# Patient Record
Sex: Female | Born: 1985 | Race: Black or African American | Hispanic: No | Marital: Single | Smoking: Current every day smoker
Health system: Southern US, Community
[De-identification: ages and names within clinical notes are randomized; demographics above are authoritative.]

## PROBLEM LIST (undated history)

## (undated) ENCOUNTER — Emergency Department (HOSPITAL_COMMUNITY): Payer: Medicaid Other

## (undated) DIAGNOSIS — G8929 Other chronic pain: Secondary | ICD-10-CM

## (undated) DIAGNOSIS — K089 Disorder of teeth and supporting structures, unspecified: Secondary | ICD-10-CM

## (undated) HISTORY — PX: EYE SURGERY: SHX253

---
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
## 2006-03-27 ENCOUNTER — Emergency Department (HOSPITAL_COMMUNITY): Admission: EM | Admit: 2006-03-27 | Discharge: 2006-03-27 | Payer: Self-pay | Admitting: *Deleted

## 2006-03-29 ENCOUNTER — Inpatient Hospital Stay (HOSPITAL_COMMUNITY): Admission: AD | Admit: 2006-03-29 | Discharge: 2006-03-29 | Payer: Self-pay | Admitting: Gynecology

## 2006-06-02 ENCOUNTER — Emergency Department (HOSPITAL_COMMUNITY): Admission: EM | Admit: 2006-06-02 | Discharge: 2006-06-03 | Payer: Self-pay | Admitting: Emergency Medicine

## 2006-10-05 ENCOUNTER — Emergency Department (HOSPITAL_COMMUNITY): Admission: EM | Admit: 2006-10-05 | Discharge: 2006-10-05 | Payer: Self-pay | Admitting: Emergency Medicine

## 2006-10-13 ENCOUNTER — Emergency Department (HOSPITAL_COMMUNITY): Admission: EM | Admit: 2006-10-13 | Discharge: 2006-10-13 | Payer: Self-pay | Admitting: Emergency Medicine

## 2007-03-10 ENCOUNTER — Emergency Department (HOSPITAL_COMMUNITY): Admission: EM | Admit: 2007-03-10 | Discharge: 2007-03-10 | Payer: Self-pay | Admitting: Emergency Medicine

## 2007-03-17 ENCOUNTER — Emergency Department (HOSPITAL_COMMUNITY): Admission: EM | Admit: 2007-03-17 | Discharge: 2007-03-17 | Payer: Self-pay | Admitting: Family Medicine

## 2007-03-22 ENCOUNTER — Emergency Department (HOSPITAL_COMMUNITY): Admission: EM | Admit: 2007-03-22 | Discharge: 2007-03-22 | Payer: Self-pay | Admitting: Emergency Medicine

## 2009-02-06 ENCOUNTER — Emergency Department (HOSPITAL_COMMUNITY): Admission: EM | Admit: 2009-02-06 | Discharge: 2009-02-06 | Payer: Self-pay | Admitting: Emergency Medicine

## 2009-08-06 ENCOUNTER — Emergency Department (HOSPITAL_COMMUNITY): Admission: EM | Admit: 2009-08-06 | Discharge: 2009-08-06 | Payer: Self-pay | Admitting: Emergency Medicine

## 2009-12-03 ENCOUNTER — Emergency Department (HOSPITAL_COMMUNITY): Admission: EM | Admit: 2009-12-03 | Discharge: 2009-12-03 | Payer: Self-pay | Admitting: Family Medicine

## 2010-09-29 LAB — WET PREP, GENITAL
Trich, Wet Prep: NONE SEEN
Yeast Wet Prep HPF POC: NONE SEEN

## 2010-09-29 LAB — POCT URINALYSIS DIP (DEVICE)
Nitrite: POSITIVE — AB
Specific Gravity, Urine: 1.025 (ref 1.005–1.030)
Urobilinogen, UA: 1 mg/dL (ref 0.0–1.0)
pH: 5.5 (ref 5.0–8.0)

## 2010-09-29 LAB — GC/CHLAMYDIA PROBE AMP, GENITAL: GC Probe Amp, Genital: NEGATIVE

## 2010-09-30 LAB — POCT URINALYSIS DIP (DEVICE)
Bilirubin Urine: NEGATIVE
Glucose, UA: NEGATIVE mg/dL
Hgb urine dipstick: NEGATIVE
Nitrite: NEGATIVE
Specific Gravity, Urine: >=1.03 (ref 1.005–1.030)
Urobilinogen, UA: 0.2 mg/dL (ref 0.0–1.0)
pH: 6 (ref 5.0–8.0)

## 2011-01-22 ENCOUNTER — Inpatient Hospital Stay (INDEPENDENT_AMBULATORY_CARE_PROVIDER_SITE_OTHER)
Admission: RE | Admit: 2011-01-22 | Discharge: 2011-01-22 | Disposition: A | Discharge status: Home / Self Care | Payer: Self-pay | Source: Ambulatory Visit | Attending: Family Medicine | Admitting: Family Medicine

## 2011-01-22 DIAGNOSIS — L989 Disorder of the skin and subcutaneous tissue, unspecified: Secondary | ICD-10-CM

## 2014-06-09 ENCOUNTER — Emergency Department (INDEPENDENT_AMBULATORY_CARE_PROVIDER_SITE_OTHER)
Admission: EM | Admit: 2014-06-09 | Discharge: 2014-06-09 | Disposition: A | Discharge status: Home / Self Care | Payer: Self-pay | Source: Home / Self Care | Attending: Family Medicine | Admitting: Family Medicine

## 2014-06-09 ENCOUNTER — Encounter (HOSPITAL_COMMUNITY): Payer: Self-pay | Admitting: *Deleted

## 2014-06-09 DIAGNOSIS — S29012A Strain of muscle and tendon of back wall of thorax, initial encounter: Secondary | ICD-10-CM

## 2014-06-09 DIAGNOSIS — L720 Epidermal cyst: Secondary | ICD-10-CM

## 2014-06-09 MED ORDER — CYCLOBENZAPRINE HCL 5 MG PO TABS: 5.0000 mg | ORAL_TABLET | Freq: Three times a day (TID) | ORAL | Status: DC | PRN

## 2014-06-09 NOTE — ED Provider Notes (Signed)
CSN: [id number]     Arrival date & time 06/09/14  1340 History   First MD Initiated Contact with Patient 06/09/14 1438     Chief Complaint  Patient presents with  . Neck Pain   (Consider location/radiation/quality/duration/timing/severity/associated sxs/prior Treatment) Patient is a 28 y.o. female presenting with neck pain. The history is provided by the patient.  Neck Pain Pain location:  Occipital region Quality:  Shooting Pain radiates to:  L scapula and R scapula Pain severity:  Mild Onset quality:  Gradual Duration:  1 week Progression:  Unchanged Chronicity:  New Context: not recent injury   Relieved by:  None tried Worsened by:  Nothing tried Ineffective treatments:  None tried Associated symptoms: no chest pain, no fever, no numbness, no paresis and no tingling     History reviewed. No pertinent past medical history. History reviewed. No pertinent past surgical history. History reviewed. No pertinent family history. History  Substance Use Topics  . Smoking status: Current Every Day Smoker  . Smokeless tobacco: Not on file  . Alcohol Use: Yes   OB History    No data available     Review of Systems  Constitutional: Negative for fever.  Respiratory: Negative.   Cardiovascular: Negative for chest pain.  Musculoskeletal: Positive for back pain and neck pain. Negative for joint swelling.  Skin: Negative.   Neurological: Negative for tingling and numbness.    Allergies  Review of patient's allergies indicates no known allergies.  Home Medications   Prior to Admission medications   Medication Sig Start Date End Date Taking? Authorizing Provider  cyclobenzaprine (FLEXERIL) 5 MG tablet Take 1 tablet (5 mg total) by mouth 3 (three) times daily as needed for muscle spasms. 06/09/14   Linna HoffJames D Raynell Scott, MD   BP 114/70 mmHg  Pulse 83  Temp(Src) 98 F (36.7 C) (Oral)  Resp 16  SpO2 97%  LMP 05/28/2014 Physical Exam  Constitutional: She is oriented to person,  place, and time. She appears well-developed and well-nourished.  Pulmonary/Chest:    Musculoskeletal:       Back:  Neurological: She is alert and oriented to person, place, and time.  Skin: Skin is warm and dry.  Inclusion cyst to right lat breast approx 5mm.  Nursing note and vitals reviewed.   ED Course  Procedures (including critical care time) Labs Review Labs Reviewed - No data to display  Imaging Review No results found.   MDM   1. Strain of mid-back, initial encounter   2. Epidermal inclusion cyst       Linna HoffJames D Jaquay Posthumus, MD 06/09/14 1526

## 2014-06-09 NOTE — ED Notes (Signed)
Pr  Reports   Pain in  Her  Neck   And  l  Shoulder   /  Arm       X  1  Week   denys  A    specefic  Injury     -  Ambulated  To  Room  With    A  Steady  Fluid     Gait                  Pt  Also  Wants  A lump in    Her  r  Breast     Checked  As   Well      She  Reports  She  Had  A  Mammogram  Done    About  6  Months  Ago   Which  She  Reports was  Neg    By  Report      She  States  She  Wants  A  Second  opinoion

## 2014-06-09 NOTE — Discharge Instructions (Signed)
Use heat and medicine and stretch as discussed, see your doctor for breast cyst removal.

## 2014-09-25 ENCOUNTER — Emergency Department (INDEPENDENT_AMBULATORY_CARE_PROVIDER_SITE_OTHER)
Admission: EM | Admit: 2014-09-25 | Discharge: 2014-09-25 | Disposition: A | Discharge status: Home / Self Care | Payer: PRIVATE HEALTH INSURANCE | Source: Home / Self Care | Attending: Family Medicine | Admitting: Family Medicine

## 2014-09-25 ENCOUNTER — Encounter (HOSPITAL_COMMUNITY): Payer: Self-pay | Admitting: Emergency Medicine

## 2014-09-25 DIAGNOSIS — J111 Influenza due to unidentified influenza virus with other respiratory manifestations: Secondary | ICD-10-CM | POA: Diagnosis not present

## 2014-09-25 DIAGNOSIS — R69 Illness, unspecified: Principal | ICD-10-CM

## 2014-09-25 LAB — POCT URINALYSIS DIP (DEVICE)
BILIRUBIN URINE: NEGATIVE
Glucose, UA: NEGATIVE mg/dL
Hgb urine dipstick: NEGATIVE
KETONES UR: NEGATIVE mg/dL
LEUKOCYTES UA: NEGATIVE
Nitrite: NEGATIVE
PH: 6 (ref 5.0–8.0)
PROTEIN: 30 mg/dL — AB
SPECIFIC GRAVITY, URINE: 1.025 (ref 1.005–1.030)
Urobilinogen, UA: 1 mg/dL (ref 0.0–1.0)

## 2014-09-25 LAB — POCT PREGNANCY, URINE: Preg Test, Ur: NEGATIVE

## 2014-09-25 MED ORDER — ONDANSETRON HCL 4 MG/2ML IJ SOLN
INTRAMUSCULAR | Status: AC
  Filled 2014-09-25: qty 2

## 2014-09-25 MED ORDER — SODIUM CHLORIDE 0.9 % IV BOLUS (SEPSIS)
1000.0000 mL | Freq: Once | INTRAVENOUS | Status: AC
  Administered 2014-09-25: 1000 mL via INTRAVENOUS

## 2014-09-25 MED ORDER — ACETAMINOPHEN 325 MG PO TABS
975.0000 mg | ORAL_TABLET | Freq: Once | ORAL | Status: AC
  Administered 2014-09-25: 975 mg via ORAL

## 2014-09-25 MED ORDER — ACETAMINOPHEN 325 MG PO TABS
ORAL_TABLET | ORAL | Status: AC
  Filled 2014-09-25: qty 3

## 2014-09-25 MED ORDER — ONDANSETRON HCL 4 MG/2ML IJ SOLN
4.0000 mg | Freq: Once | INTRAMUSCULAR | Status: AC
  Administered 2014-09-25: 4 mg via INTRAVENOUS

## 2014-09-25 MED ORDER — PROMETHAZINE HCL 25 MG PO TABS
25.0000 mg | ORAL_TABLET | Freq: Four times a day (QID) | ORAL | Status: DC | PRN
Start: 2014-09-25 — End: 2019-06-05

## 2014-09-25 NOTE — ED Provider Notes (Signed)
Martha West is a 29 y.o. female who presents to Urgent Care today for body aches headache and fevers and vomiting. Symptoms started yesterday. Patient developed fever this morning. Patient has tried ibuprofen yesterday. No chest pains or palpitations. Patient did not receive a flu vaccine. She also notes some urinary frequency and urgency over the last several days.    History reviewed. No pertinent past medical history. History reviewed. No pertinent past surgical history. History  Substance Use Topics  . Smoking status: Former Games developermoker  . Smokeless tobacco: Not on file  . Alcohol Use: Yes   ROS as above Medications: No current facility-administered medications for this encounter.   Current Outpatient Prescriptions  Medication Sig Dispense Refill  . promethazine (PHENERGAN) 25 MG tablet Take 1 tablet (25 mg total) by mouth every 6 (six) hours as needed for nausea or vomiting. 30 tablet 0   No Known Allergies   Exam:  BP 135/82 mmHg  Pulse 68  Temp(Src) 102.1 F (38.9 C) (Oral)  Resp 20  SpO2 96%  LMP 09/12/2014 (Exact Date) Gen: Well NAD HEENT: EOMI,  MMM Lungs: Normal work of breathing. CTABL Heart: RRR no MRG Abd: NABS, Soft. Nondistended, Nontender no rebound or guarding Exts: Brisk capillary refill, warm and well perfused.   Patient was given a 1 L normal saline bolus, 4 mg of IV Zofran, and 975 mg of by mouth Tylenol, and felt much better. Her temperature decreased to 102 from 103.58F  Results for orders placed or performed during the hospital encounter of 09/25/14 (from the past 24 hour(s))  POCT urinalysis dip (device)     Status: Abnormal   Collection Time: 09/25/14  1:56 PM  Result Value Ref Range   Glucose, UA NEGATIVE NEGATIVE mg/dL   Bilirubin Urine NEGATIVE NEGATIVE   Ketones, ur NEGATIVE NEGATIVE mg/dL   Specific Gravity, Urine 1.025 1.005 - 1.030   Hgb urine dipstick NEGATIVE NEGATIVE   pH 6.0 5.0 - 8.0   Protein, ur 30 (A) NEGATIVE mg/dL   Urobilinogen, UA 1.0 0.0 - 1.0 mg/dL   Nitrite NEGATIVE NEGATIVE   Leukocytes, UA NEGATIVE NEGATIVE  Pregnancy, urine POC     Status: None   Collection Time: 09/25/14  1:58 PM  Result Value Ref Range   Preg Test, Ur NEGATIVE NEGATIVE   No results found.  Assessment and Plan: 29 y.o. female with influenza. Most likely diagnosis. Currently influenza is prevalent in the community. She does not have any abdominal pain nor abnormalities on pulmonary exam. Treat with NSAIDs and watchful waiting. Patient cannot afford Tamiflu. Treat vomiting with Phenergan.  Discussed warning signs or symptoms. Please see discharge instructions. Patient expresses understanding.     Rodolph BongEvan S Chikita Dogan, MD 09/25/14 [phone]

## 2014-09-25 NOTE — Discharge Instructions (Signed)
Thank you for coming in today. Take up to 2 aleve twice daily as needed for pain and fever.  Use phenergan as needed for vomiting.  Go to the ER if you get worse.  Call or go to the emergency room if you get worse, have trouble breathing, have chest pains, or palpitations.  If your belly pain worsens, or you have high fever, bad vomiting, blood in your stool or black tarry stool go to the Emergency Room.    Influenza Influenza ("the flu") is a viral infection of the respiratory tract. It occurs more often in winter months because people spend more time in close contact with one another. Influenza can make you feel very sick. Influenza easily spreads from person to person (contagious). CAUSES  Influenza is caused by a virus that infects the respiratory tract. You can catch the virus by breathing in droplets from an infected person's cough or sneeze. You can also catch the virus by touching something that was recently contaminated with the virus and then touching your mouth, nose, or eyes. RISKS AND COMPLICATIONS You may be at risk for a more severe case of influenza if you smoke cigarettes, have diabetes, have chronic heart disease (such as heart failure) or lung disease (such as asthma), or if you have a weakened immune system. Elderly people and pregnant women are also at risk for more serious infections. The most common problem of influenza is a lung infection (pneumonia). Sometimes, this problem can require emergency medical care and may be life threatening. SIGNS AND SYMPTOMS  Symptoms typically last 4 to 10 days and may include:  Fever.  Chills.  Headache, body aches, and muscle aches.  Sore throat.  Chest discomfort and cough.  Poor appetite.  Weakness or feeling tired.  Dizziness.  Nausea or vomiting. DIAGNOSIS  Diagnosis of influenza is often made based on your history and a physical exam. A nose or throat swab test can be done to confirm the diagnosis. TREATMENT  In mild  cases, influenza goes away on its own. Treatment is directed at relieving symptoms. For more severe cases, your health care provider may prescribe antiviral medicines to shorten the sickness. Antibiotic medicines are not effective because the infection is caused by a virus, not by bacteria. HOME CARE INSTRUCTIONS  Take medicines only as directed by your health care provider.  Use a cool mist humidifier to make breathing easier.  Get plenty of rest until your temperature returns to normal. This usually takes 3 to 4 days.  Drink enough fluid to keep your urine clear or pale yellow.  Cover yourmouth and nosewhen coughing or sneezing,and wash your handswellto prevent thevirusfrom spreading.  Stay homefromwork orschool untilthe fever is gonefor at least 74full day. PREVENTION  An annual influenza vaccination (flu shot) is the best way to avoid getting influenza. An annual flu shot is now routinely recommended for all adults in the [place] SEEK MEDICAL CARE IF:  You experiencechest pain, yourcough worsens,or you producemore mucus.  Youhave nausea,vomiting, ordiarrhea.  Your fever returns or gets worse. SEEK IMMEDIATE MEDICAL CARE IF:  You havetrouble breathing, you become short of breath,or your skin ornails becomebluish.  You have severe painor stiffnessin the neck.  You develop a sudden headache, or pain in the face or ear.  You have nausea or vomiting that you cannot control. MAKE SURE YOU:   Understand these instructions.  Will watch your condition.  Will get help right away if you are not doing well or get worse. Document  Released: 06/27/2000 Document Revised: 11/14/2013 Document Reviewed: 09/29/2011 [facility] Patient Information 2015 [employer], [place]. This information is not intended to replace advice given to you by your health care provider. Make sure you discuss any questions you have with your health care provider.

## 2014-09-25 NOTE — ED Notes (Signed)
Pt came here today complaining of cold symptoms.  Pt was vomiting when I entered the room and the pt has a fever of 103.5.  Pt states she has generalized body aches.

## 2014-09-27 ENCOUNTER — Telehealth (HOSPITAL_COMMUNITY): Payer: Self-pay | Admitting: *Deleted

## 2014-09-27 NOTE — ED Notes (Signed)
Pt. called on VM on 3/15 for her lab results. I called pt. back. Pt. verified x 2 and given neg. Results. Vassie MoselleYork, Sricharan Lacomb M 09/27/2014

## 2015-01-29 ENCOUNTER — Encounter: Payer: PRIVATE HEALTH INSURANCE | Admitting: Obstetrics & Gynecology

## 2015-02-19 ENCOUNTER — Encounter: Payer: PRIVATE HEALTH INSURANCE | Admitting: Obstetrics & Gynecology

## 2015-06-12 ENCOUNTER — Emergency Department (INDEPENDENT_AMBULATORY_CARE_PROVIDER_SITE_OTHER)
Admission: EM | Admit: 2015-06-12 | Discharge: 2015-06-12 | Disposition: A | Discharge status: Home / Self Care | Payer: Self-pay | Source: Home / Self Care

## 2015-06-12 ENCOUNTER — Emergency Department (INDEPENDENT_AMBULATORY_CARE_PROVIDER_SITE_OTHER): Payer: PRIVATE HEALTH INSURANCE

## 2015-06-12 ENCOUNTER — Encounter (HOSPITAL_COMMUNITY): Payer: Self-pay

## 2015-06-12 DIAGNOSIS — M25512 Pain in left shoulder: Secondary | ICD-10-CM

## 2015-06-12 MED ORDER — HYDROCODONE-ACETAMINOPHEN 5-325 MG PO TABS: 2.0000 | ORAL_TABLET | Freq: Four times a day (QID) | ORAL | Status: DC | PRN

## 2015-06-12 MED ORDER — NAPROXEN 500 MG PO TABS: 500.0000 mg | ORAL_TABLET | Freq: Two times a day (BID) | ORAL | Status: DC

## 2015-06-12 NOTE — Discharge Instructions (Signed)
Heat Therapy °Heat therapy can help ease sore, stiff, injured, and tight muscles and joints. Heat relaxes your muscles, which may help ease your pain. Heat therapy should only be used on old, pre-existing, or long-lasting (chronic) injuries. Do not use heat therapy unless told by your doctor. °HOW TO USE HEAT THERAPY °There are several different kinds of heat therapy, including: °· Moist heat pack. °· Warm water bath. °· Hot water bottle. °· Electric heating pad. °· Heated gel pack. °· Heated wrap. °· Electric heating pad. °GENERAL HEAT THERAPY RECOMMENDATIONS  °· Do not sleep while using heat therapy. Only use heat therapy while you are awake. °· Your skin may turn pink while using heat therapy. Do not use heat therapy if your skin turns red. °· Do not use heat therapy if you have new pain. °· High heat or long exposure to heat can cause burns. Be careful when using heat therapy to avoid burning your skin. °· Do not use heat therapy on areas of your skin that are already irritated, such as with a rash or sunburn. °GET HELP IF:  °· You have blisters, redness, swelling (puffiness), or numbness. °· You have new pain. °· Your pain is worse. °MAKE SURE YOU: °· Understand these instructions. °· Will watch your condition. °· Will get help right away if you are not doing well or get worse. °  °This information is not intended to replace advice given to you by your health care provider. Make sure you discuss any questions you have with your health care provider. °  °Document Released: 09/22/2011 Document Revised: 07/21/2014 Document Reviewed: 08/23/2013 °Elsevier Interactive Patient Education ©2016 Elsevier Inc. ° °

## 2015-06-12 NOTE — ED Provider Notes (Signed)
CSN: [id number]     Arrival date & time 06/12/15  1814 History   None    Chief Complaint  Patient presents with  . Arm Pain  . Headache   (Consider location/radiation/quality/duration/timing/severity/associated sxs/prior Treatment) HPI History obtained from patient:   LOCATION:left shoulder SEVERITY: 4 DURATION: 2 years CONTEXT: Sorting and hanging clothes QUALITY: Ache MODIFYING FACTORS: None ASSOCIATED SYMPTOMS: Neck pain TIMING: Episodic    History reviewed. No pertinent past medical history. History reviewed. No pertinent past surgical history. History reviewed. No pertinent family history. Social History  Substance Use Topics  . Smoking status: Former Games developermoker  . Smokeless tobacco: None  . Alcohol Use: Yes   OB History    No data available     Review of Systems ROS +'ve the shoulder pain  Denies: HEADACHE, NAUSEA, ABDOMINAL PAIN, CHEST PAIN, CONGESTION, DYSURIA, SHORTNESS OF BREATH  Allergies  Review of patient's allergies indicates no known allergies.  Home Medications   Prior to Admission medications   Medication Sig Start Date End Date Taking? Authorizing Provider  promethazine (PHENERGAN) 25 MG tablet Take 1 tablet (25 mg total) by mouth every 6 (six) hours as needed for nausea or vomiting. 09/25/14   Rodolph BongEvan S Corey, MD   Meds Ordered and Administered this Visit  Medications - No data to display  BP 115/76 mmHg  Pulse 90  Temp(Src) 98.1 F (36.7 C) (Oral)  Resp 16  SpO2 100% No data found.   Physical Exam  Constitutional: She appears well-developed and well-nourished.  HENT:  Head: Normocephalic and atraumatic.  Neck: Normal range of motion. Neck supple.  Pulmonary/Chest: Effort normal.  Musculoskeletal:       Left shoulder: She exhibits tenderness and pain. She exhibits normal range of motion, no swelling, no effusion, no crepitus, no spasm and normal strength.       Arms:   ED Course  Procedures (including critical care time)  Labs  Review Labs Reviewed - No data to display  Imaging Review Dg Shoulder Left  06/12/2015  CLINICAL DATA:  Left shoulder pain and superior humerus pain, chronic but more severe today EXAM: LEFT SHOULDER - 2+ VIEW COMPARISON:  None. FINDINGS: Mild inferiorly projecting spur off of the acromion. Mild bony hypertrophy of the greater tuberosity at cuff tendon insertion site. No fracture or dislocation. IMPRESSION: No acute osseous abnormalities. Findings suggest degenerative changes at rotator cuff insertion site on the greater tuberosity. Electronically Signed   By: Esperanza Heiraymond  Rubner M.D.   On: 06/12/2015 20:11     Visual Acuity Review  Right Eye Distance:   Left Eye Distance:   Bilateral Distance:    Right Eye Near:   Left Eye Near:    Bilateral Near:         MDM   1. Shoulder pain, acute, left       No acute abnormality on xray.  Symptomatic treatment If not steadily improving may need to see orthopedic physician.   Rx naprosyn norco Return if new or worsening of symptoms.     Tharon AquasFrank C Lydell Moga, PA 06/13/15 1029

## 2015-06-12 NOTE — ED Notes (Signed)
C/o having problems w left arm for "years" and reports not able to lift w left arm or comb hair

## 2015-08-08 ENCOUNTER — Emergency Department (HOSPITAL_COMMUNITY)
Admission: EM | Admit: 2015-08-08 | Discharge: 2015-08-08 | Disposition: A | Discharge status: Home / Self Care | Payer: Self-pay | Attending: Emergency Medicine | Admitting: Emergency Medicine

## 2015-08-08 ENCOUNTER — Encounter (HOSPITAL_COMMUNITY): Payer: Self-pay | Admitting: Neurology

## 2015-08-08 DIAGNOSIS — F17200 Nicotine dependence, unspecified, uncomplicated: Secondary | ICD-10-CM | POA: Insufficient documentation

## 2015-08-08 DIAGNOSIS — G8929 Other chronic pain: Secondary | ICD-10-CM | POA: Insufficient documentation

## 2015-08-08 DIAGNOSIS — Z791 Long term (current) use of non-steroidal anti-inflammatories (NSAID): Secondary | ICD-10-CM | POA: Insufficient documentation

## 2015-08-08 DIAGNOSIS — K089 Disorder of teeth and supporting structures, unspecified: Secondary | ICD-10-CM

## 2015-08-08 DIAGNOSIS — K0889 Other specified disorders of teeth and supporting structures: Secondary | ICD-10-CM | POA: Insufficient documentation

## 2015-08-08 HISTORY — DX: Other chronic pain: G89.29

## 2015-08-08 HISTORY — DX: Disorder of teeth and supporting structures, unspecified: K08.9

## 2015-08-08 MED ORDER — BUPIVACAINE-EPINEPHRINE (PF) 0.5% -1:200000 IJ SOLN
1.8000 mL | Freq: Once | INTRAMUSCULAR | Status: AC
  Administered 2015-08-08: 1.8 mL

## 2015-08-08 NOTE — Discharge Instructions (Signed)
[employer]  °[employer]  °[facility]  °[address]  °[place], [place] [address]  °Phone [phone]  °[facility] [employer] [facility] in [place], [place], exemplifies the Dental School’s vision to improve the health and quality of life of all [place]ians by creating leaders with a passion to care for the underserved and by leading the nation in community-based, service learning oral health education. °We are committed to offering comprehensive general dental services for adults, children and special needs patients in a safe, caring and professional setting. ° °Appointments: Our clinic is open Monday through Friday 8:00 a.m. until 5:00 p.m. The amount of time scheduled for an appointment depends on the patient’s specific needs. We ask that you keep your appointed time for care or provide 24-hour notice of all appointment changes. Parents or legal guardians must accompany minor children. ° °Payment for Services: Medicaid and other insurance plans are welcome. Payment for services is due when services are rendered and may be made by cash or credit card. If you have dental insurance, we will assist you with your claim submission.  ° °Emergencies: Emergency services will be provided Monday through Friday on a walk-in basis. Please arrive early for emergency services. After hours emergency services will be provided for patients of record as required. ° °Services:  °Comprehensive General Dentistry  °Children’s Dentistry  °Oral Surgery - Extractions  °Root Canals  °Sealants and Tooth Colored Fillings  °Crowns and Bridges  °Dentures and Partial Dentures  °Implant Services  °Periodontal Services and Cleanings  °Cosmetic Tooth Whitening  °Digital Radiography  °3-D/Cone Beam Imaging ° ° °

## 2015-08-08 NOTE — ED Notes (Signed)
Declined W/C at D/C and was escorted to lobby by RN. 

## 2015-08-08 NOTE — ED Provider Notes (Signed)
CSN: [id number]     Arrival date & time 08/08/15  4098 History  By signing my name below, I, Martha West, attest that this documentation has been prepared under the direction and in the presence of [employer], PA-C.  Electronically Signed: Murriel West, ED Scribe. 08/08/2015. 11:40 AM.    Chief Complaint  Patient presents with  . Dental Pain      Patient is a 30 y.o. female presenting with tooth pain. The history is provided by the patient. No language interpreter was used.  Dental Pain  HPI Comments: Martha West is a 30 y.o. female who presents to the Emergency Department complaining of constant, worsening left maxillary dental pain that has been present for 8 years. Pt reports her tooth has fallen out in small pieces for years, and reports her pain has been worse for the last 3 days. Pt denies doing anything to treat her pain prior to coming to ED. Pt states she has never seen a dentist about this, and states she does not have a dentist she regularly sees. Pt denies fever, chills, or any other symptoms.    Past Medical History  Diagnosis Date  . Chronic dental pain    History reviewed. No pertinent past surgical history. No family history on file. Social History  Substance Use Topics  . Smoking status: Current Every Day Smoker  . Smokeless tobacco: None  . Alcohol Use: Yes   OB History    No data available     Review of Systems  A complete 10 system review of systems was obtained and all systems are negative except as noted in the HPI and PMH.    Allergies  Review of patient's allergies indicates no known allergies.  Home Medications   Prior to Admission medications   Medication Sig Start Date End Date Taking? Authorizing Provider  HYDROcodone-acetaminophen (NORCO/VICODIN) 5-325 MG tablet Take 2 tablets by mouth every 6 (six) hours as needed for moderate pain. 06/12/15   Tharon Aquas, PA  naproxen (NAPROSYN) 500 MG tablet Take 1 tablet (500 mg total) by  mouth 2 (two) times daily. 06/12/15   Tharon Aquas, PA  promethazine (PHENERGAN) 25 MG tablet Take 1 tablet (25 mg total) by mouth every 6 (six) hours as needed for nausea or vomiting. 09/25/14   Rodolph Bong, MD   BP 117/74 mmHg  Pulse 88  Temp(Src) 99.1 F (37.3 C)  Resp 16  SpO2 100%  LMP 07/15/2015 Physical Exam  Constitutional: She is oriented to person, place, and time. She appears well-developed and well-nourished. No distress.  HENT:  Head: Normocephalic.  Mouth/Throat: Uvula is midline, oropharynx is clear and moist and mucous membranes are normal. No oropharyngeal exudate, posterior oropharyngeal edema, posterior oropharyngeal erythema or tonsillar abscesses.  External exam shows no asymmetry of the jaw line or face, no signs of obvious swelling, edema, infection. Full active range of motion of the jaw. Neck is supple with full active range of motion, no tenderness to palpation of the soft tissues  Gumline palpated no obvious signs of infection including warmth, redness, abscess, tenderness. Posterior oropharynx clear with no signs of infection, uvula is midline and rises with phonation, tonsils present and normal in size, symmetrical bilateral, tongue is normal soft touch with full active range of motion, floor mouth is soft nontender.   numberous caries throughout Multiple teeth broken at the gumline  Eyes: Conjunctivae are normal. Pupils are equal, round, and reactive to light. Right eye exhibits no  discharge. Left eye exhibits no discharge.  Neck: Normal range of motion. Neck supple. No JVD present. No tracheal deviation present. No thyromegaly present.  Pulmonary/Chest: No stridor.  Lymphadenopathy:    She has no cervical adenopathy.  Neurological: She is alert and oriented to person, place, and time.  Skin: Skin is warm and dry. No rash noted. She is not diaphoretic. No erythema. No pallor.  Psychiatric: She has a normal mood and affect. Her behavior is normal. Judgment  and thought content normal.  Nursing note and vitals reviewed.   ED Course  Procedures (including critical care time)  NERVE BLOCK Performed by: Thermon Leyland Consent: Verbal consent obtained. Required items: required blood products, implants, devices, and special equipment available Time out: Immediately prior to procedure a "time out" was called to verify the correct patient, procedure, equipment, support staff and site/side marked as required.  Indication: Dental pain  Nerve block body site: Right lower jaw   Preparation: Patient was prepped and draped in the usual sterile fashion. Needle gauge: 24 G Location technique: anatomical landmarks  Local anesthetic: Bupivacaine   Anesthetic total: 1.8 ml  Outcome: pain improved Patient tolerance: Patient tolerated the procedure well with no immediate complications.  DIAGNOSTIC STUDIES: Oxygen Saturation is 97% on room air, normal by my interpretation.    COORDINATION OF CARE: 11:05 AM Discussed treatment plan with pt at bedside and pt agreed to plan.      MDM   Final diagnoses:  Pain, dental   Labs:  Imaging:  Consults:  Therapeutics: Dental block  Discharge Meds:   Assessment/Plan: Patient presents with dental pain. She has no signs of infection on exam. Patient has part of her first right lower molar missing, this is likely due to significant dental caries. Patient will be referred to dentist for follow-up evaluation.     I personally performed the services described in this documentation, which was scribed in my presence. The recorded information has been reviewed and is accurate.     Eyvonne Mechanic, PA-C 08/08/15 1435  Benjiman Core, MD 08/09/15 [phone]

## 2015-08-08 NOTE — ED Notes (Signed)
Pt here with left upper dental pain for 8 years. Reports pain has been worse for last 3 days.

## 2015-09-16 ENCOUNTER — Emergency Department (INDEPENDENT_AMBULATORY_CARE_PROVIDER_SITE_OTHER)
Admission: EM | Admit: 2015-09-16 | Discharge: 2015-09-16 | Disposition: A | Discharge status: Home / Self Care | Payer: Self-pay | Source: Home / Self Care | Attending: Family Medicine | Admitting: Family Medicine

## 2015-09-16 ENCOUNTER — Encounter (HOSPITAL_COMMUNITY): Payer: Self-pay

## 2015-09-16 DIAGNOSIS — K219 Gastro-esophageal reflux disease without esophagitis: Secondary | ICD-10-CM

## 2015-09-16 DIAGNOSIS — G43A0 Cyclical vomiting, not intractable: Secondary | ICD-10-CM

## 2015-09-16 DIAGNOSIS — R1115 Cyclical vomiting syndrome unrelated to migraine: Secondary | ICD-10-CM

## 2015-09-16 LAB — POCT URINALYSIS DIP (DEVICE)
Bilirubin Urine: NEGATIVE
Glucose, UA: NEGATIVE mg/dL
KETONES UR: NEGATIVE mg/dL
Leukocytes, UA: NEGATIVE
Nitrite: NEGATIVE
PH: 6 (ref 5.0–8.0)
PROTEIN: NEGATIVE mg/dL
Specific Gravity, Urine: 1.025 (ref 1.005–1.030)
Urobilinogen, UA: 0.2 mg/dL (ref 0.0–1.0)

## 2015-09-16 LAB — POCT PREGNANCY, URINE: PREG TEST UR: NEGATIVE

## 2015-09-16 MED ORDER — OMEPRAZOLE 40 MG PO CPDR: 40.0000 mg | DELAYED_RELEASE_CAPSULE | Freq: Every day | ORAL | Status: DC

## 2015-09-16 NOTE — ED Provider Notes (Signed)
CSN: [id number]     Arrival date & time 09/16/15  1950 History   First MD Initiated Contact with Patient 09/16/15 2128     Chief Complaint  Patient presents with  . Emesis   (Consider location/radiation/quality/duration/timing/severity/associated sxs/prior Treatment) Patient is a 30 y.o. female presenting with vomiting. The history is provided by the patient. No language interpreter was used.  Emesis  Patient presents with complaint of daily vomiting, with heartburn, for the past 1 week.  Reports that she feels well otherwise, does not feel ill.  Is concerned that this may be the presentation of pregnancy.  LMP began Feb 26th and ended March 3rd; has irregular menses.  [facility] this week to see if it would help the heartburn, it did not.    Reports that she has a normal appetite for food; reports that she tends to eat large meals, and upon further questioning reflects that her emesis may be more frequent after she has eaten a full meal.  Last emesis was around 6pm today.  Has occurred about once or twice daily.  Is tolerating other intake without nausea or emesis.   PMHx; no medications.  She has had no abd surgeries.  She is a G2P0 with 2 elective Abs when she was age 30 and [age].   Is not on contraception now, is sexually active.   ROS: denies fevers or chills; no diarrhea, no abdominal pain, no dysuria or polyuria, no vaginal discharge. LMP as above. No chest pain, no palpitations.    Social Hx; is sexually active. Smokes about 1ppd cigarettes, no alcohol, no THC or other drugs.  Past Medical History  Diagnosis Date  . Chronic dental pain    History reviewed. No pertinent past surgical history. No family history on file. Social History  Substance Use Topics  . Smoking status: Current Every Day Smoker  . Smokeless tobacco: None  . Alcohol Use: Yes   OB History    No data available     Review of Systems  Gastrointestinal: Positive for vomiting.   All other systems reviewed and are negative.   Allergies  Review of patient's allergies indicates no known allergies.  Home Medications   Prior to Admission medications   Medication Sig Start Date End Date Taking? Authorizing Provider  HYDROcodone-acetaminophen (NORCO/VICODIN) 5-325 MG tablet Take 2 tablets by mouth every 6 (six) hours as needed for moderate pain. 06/12/15   Tharon AquasFrank C Patrick, PA  naproxen (NAPROSYN) 500 MG tablet Take 1 tablet (500 mg total) by mouth 2 (two) times daily. 06/12/15   Tharon AquasFrank C Patrick, PA  omeprazole (PRILOSEC) 40 MG capsule Take 1 capsule (40 mg total) by mouth daily. 09/16/15   Barbaraann BarthelJames O Celso Granja, MD  promethazine (PHENERGAN) 25 MG tablet Take 1 tablet (25 mg total) by mouth every 6 (six) hours as needed for nausea or vomiting. 09/25/14   Rodolph BongEvan S Corey, MD   Meds Ordered and Administered this Visit  Medications - No data to display  BP 116/87 mmHg  Pulse 95  Temp(Src) 98.1 F (36.7 C) (Oral)  Resp 14  SpO2 100% No data found.   Physical Exam  Constitutional: She appears well-developed and well-nourished. No distress.  HENT:  Head: Normocephalic and atraumatic.  Eyes: Conjunctivae are normal. Pupils are equal, round, and reactive to light.  Neck: Normal range of motion. Neck supple.  Cardiovascular: Normal rate, regular rhythm and normal heart sounds.   No murmur heard. Pulmonary/Chest: Effort  normal and breath sounds normal. No respiratory distress. She has no wheezes. She has no rales. She exhibits no tenderness.  Abdominal: Soft. Bowel sounds are normal. She exhibits no distension and no mass. There is no tenderness. There is no rebound and no guarding.  Lymphadenopathy:    She has no cervical adenopathy.  Neurological: She is alert.  Skin: Skin is warm and dry. She is not diaphoretic.  Psychiatric: She has a normal mood and affect. Her behavior is normal. Judgment and thought content normal.    ED Course  Procedures (including critical care  time)  Labs Review Labs Reviewed  POCT URINALYSIS DIP (DEVICE) - Abnormal; Notable for the following:    Hgb urine dipstick TRACE (*)    All other components within normal limits  POCT PREGNANCY, URINE    Imaging Review No results found.   Visual Acuity Review  Right Eye Distance:   Left Eye Distance:   Bilateral Distance:    Right Eye Near:   Left Eye Near:    Bilateral Near:         MDM   1. Gastroesophageal reflux disease without esophagitis   2. Non-intractable cyclical vomiting with nausea    Patient with intermittent vomiting, which may be related to her recent experience of reflux.  She has a negative pregnancy test in the urgent care today.  She is taking prenatal vitamins, is ambivalent about becoming pregnant (reflects on her age and that she has not had children).  Her exam is essentially unremarkable today for underlying causes. I suspect she may have an element of GERD with possible early hiatal hernia that causes her to vomit after heavy meals.  Encouraged to take PPI daily and reduce the size of her meals, spread her intake out over the day in smaller amounts.  She is encouraged to establish with a primary care doctor for further follow up, or to return to the [facility] if symptoms worsen.   Paula Compton, MD    Barbaraann Barthel, MD 09/16/15 [phone]

## 2015-09-16 NOTE — ED Notes (Signed)
Patient complains of vomiting and heart burn for the past week Last time patient vomited was two hours ago

## 2015-09-16 NOTE — Discharge Instructions (Signed)
It was a pleasure to see you today.  Your pregnancy test in the urgent care center is negative.   I believe your vomiting is related to the heartburn; this may be worsened by eating large meals.  [facility] will also worsen these symptoms, so I recommend stopping it.   I am starting you on an acid blocker, OMEPRAZOLE 40mg  take 1 tablet daily in the morning.    If the vomiting continues, I recommend returning to the urgent care center or establishing with a primary care doctor for follow up.

## 2017-02-05 ENCOUNTER — Ambulatory Visit: Payer: Self-pay | Admitting: Family Medicine

## 2018-01-31 ENCOUNTER — Emergency Department (HOSPITAL_COMMUNITY)
Admission: EM | Admit: 2018-01-31 | Discharge: 2018-01-31 | Disposition: A | Discharge status: Home / Self Care | Payer: Self-pay | Attending: Emergency Medicine | Admitting: Emergency Medicine

## 2018-01-31 ENCOUNTER — Encounter (HOSPITAL_COMMUNITY): Payer: Self-pay

## 2018-01-31 ENCOUNTER — Other Ambulatory Visit: Payer: Self-pay

## 2018-01-31 DIAGNOSIS — R2243 Localized swelling, mass and lump, lower limb, bilateral: Secondary | ICD-10-CM | POA: Insufficient documentation

## 2018-01-31 DIAGNOSIS — R110 Nausea: Secondary | ICD-10-CM | POA: Insufficient documentation

## 2018-01-31 DIAGNOSIS — Z79899 Other long term (current) drug therapy: Secondary | ICD-10-CM | POA: Insufficient documentation

## 2018-01-31 DIAGNOSIS — R42 Dizziness and giddiness: Secondary | ICD-10-CM | POA: Insufficient documentation

## 2018-01-31 DIAGNOSIS — F17200 Nicotine dependence, unspecified, uncomplicated: Secondary | ICD-10-CM | POA: Insufficient documentation

## 2018-01-31 DIAGNOSIS — Z3202 Encounter for pregnancy test, result negative: Secondary | ICD-10-CM

## 2018-01-31 LAB — BASIC METABOLIC PANEL
Anion gap: 8 (ref 5–15)
BUN: 6 mg/dL (ref 6–20)
CHLORIDE: 109 mmol/L (ref 98–111)
CO2: 24 mmol/L (ref 22–32)
CREATININE: 0.78 mg/dL (ref 0.44–1.00)
Calcium: 8.8 mg/dL — ABNORMAL LOW (ref 8.9–10.3)
GFR calc Af Amer: >60 mL/min (ref >60)
GFR calc non Af Amer: >60 mL/min (ref >60)
Glucose, Bld: 153 mg/dL — ABNORMAL HIGH (ref 70–99)
POTASSIUM: 4.5 mmol/L (ref 3.5–5.1)
Sodium: 141 mmol/L (ref 135–145)

## 2018-01-31 LAB — URINALYSIS, ROUTINE W REFLEX MICROSCOPIC
BILIRUBIN URINE: NEGATIVE
Glucose, UA: NEGATIVE mg/dL
HGB URINE DIPSTICK: NEGATIVE
Ketones, ur: 5 mg/dL — AB
Leukocytes, UA: NEGATIVE
NITRITE: NEGATIVE
PH: 5 (ref 5.0–8.0)
Protein, ur: NEGATIVE mg/dL
SPECIFIC GRAVITY, URINE: 1.028 (ref 1.005–1.030)

## 2018-01-31 LAB — CBC
HEMATOCRIT: 46.4 % — AB (ref 36.0–46.0)
HEMOGLOBIN: 15.1 g/dL — AB (ref 12.0–15.0)
MCH: 30.7 pg (ref 26.0–34.0)
MCHC: 32.5 g/dL (ref 30.0–36.0)
MCV: 94.3 fL (ref 78.0–100.0)
Platelets: 283 10*3/uL (ref 150–400)
RBC: 4.92 MIL/uL (ref 3.87–5.11)
RDW: 12.3 % (ref 11.5–15.5)
WBC: 6.2 10*3/uL (ref 4.0–10.5)

## 2018-01-31 LAB — I-STAT BETA HCG BLOOD, ED (MC, WL, AP ONLY): I-stat hCG, quantitative: <5 m[IU]/mL (ref <5)

## 2018-01-31 LAB — CBG MONITORING, ED: GLUCOSE-CAPILLARY: 150 mg/dL — AB (ref 70–99)

## 2018-01-31 NOTE — Discharge Instructions (Signed)
Return to ED for worsening symptoms, severe abdominal pain or chest pain, numbness in your legs, lightheadedness or loss of consciousness.

## 2018-01-31 NOTE — ED Triage Notes (Signed)
Pt states she has been feeling nauseous, dizzy, and has ankle swelling. She is concerned she could be pregnant. LMP 01/27/18

## 2018-01-31 NOTE — ED Provider Notes (Signed)
[facility] EMERGENCY DEPARTMENT Provider Note   CSN: [id number] Arrival date & time: 01/31/18  1449     History   Chief Complaint Chief Complaint  Patient presents with  . Possible Pregnancy    HPI Martha West is a 32 y.o. female who presents to ED for evaluation of nausea, dizziness and bilateral ankle swelling for the past week.  She wears compression stockings that have helped her swelling.  She is concerned that she may be pregnant.  However, she completed her menstrual cycle 4 days ago.  She denies any specific pain.  She states that she will have bilateral ankle swelling when she stands a lot at work.  Denies any chest pain, abdominal pain, abnormal vaginal bleeding, vaginal discharge, urinary symptoms, diarrhea, vomiting, fever.  States that she is here to "get everything checked out."  HPI  Past Medical History:  Diagnosis Date  . Chronic dental pain     Patient Active Problem List   Diagnosis Date Noted  . Chronic dental pain     History reviewed. No pertinent surgical history.   OB History   None      Home Medications    Prior to Admission medications   Medication Sig Start Date End Date Taking? Authorizing Provider  HYDROcodone-acetaminophen (NORCO/VICODIN) 5-325 MG tablet Take 2 tablets by mouth every 6 (six) hours as needed for moderate pain. 06/12/15   Tharon Aquas, PA  naproxen (NAPROSYN) 500 MG tablet Take 1 tablet (500 mg total) by mouth 2 (two) times daily. 06/12/15   Tharon Aquas, PA  omeprazole (PRILOSEC) 40 MG capsule Take 1 capsule (40 mg total) by mouth daily. 09/16/15   Barbaraann Barthel, MD  promethazine (PHENERGAN) 25 MG tablet Take 1 tablet (25 mg total) by mouth every 6 (six) hours as needed for nausea or vomiting. 09/25/14   Rodolph Bong, MD    Family History History reviewed. No pertinent family history.  Social History Social History   Tobacco Use  . Smoking status: Current Every Day Smoker  .  Smokeless tobacco: Never Used  Substance Use Topics  . Alcohol use: Yes  . Drug use: No     Allergies   Patient has no known allergies.   Review of Systems Review of Systems  Constitutional: Negative for appetite change, chills and fever.  HENT: Negative for ear pain, rhinorrhea, sneezing and sore throat.   Eyes: Negative for photophobia and visual disturbance.  Respiratory: Negative for cough, chest tightness, shortness of breath and wheezing.   Cardiovascular: Positive for leg swelling. Negative for chest pain and palpitations.  Gastrointestinal: Positive for nausea. Negative for abdominal pain, blood in stool, constipation, diarrhea and vomiting.  Genitourinary: Negative for dysuria, hematuria and urgency.  Musculoskeletal: Negative for myalgias.  Skin: Negative for rash.  Neurological: Negative for dizziness, weakness and light-headedness.     Physical Exam Updated Vital Signs BP (!) 111/48 (BP Location: Right Arm)   Pulse 83   Temp 98.3 F (36.8 C) (Oral)   Resp 14   Ht 5\' 3"  (1.6 m)   Wt 73.9 kg (163 lb)   LMP 01/27/2018 (Within Days)   SpO2 97%   BMI 28.87 kg/m   Physical Exam  Constitutional: She appears well-developed and well-nourished. No distress.  HENT:  Head: Normocephalic and atraumatic.  Nose: Nose normal.  Eyes: Conjunctivae and EOM are normal. Right eye exhibits no discharge. Left eye exhibits no discharge. No scleral icterus.  Neck: Normal range  of motion. Neck supple.  Cardiovascular: Normal rate, regular rhythm, normal heart sounds and intact distal pulses. Exam reveals no gallop and no friction rub.  No murmur heard. Pulmonary/Chest: Effort normal and breath sounds normal. No respiratory distress.  Abdominal: Soft. Bowel sounds are normal. She exhibits no distension. There is no tenderness. There is no guarding.  Musculoskeletal: Normal range of motion. She exhibits no edema.  No lower extremity edema, erythema or calf tenderness bilaterally.    Neurological: She is alert. She exhibits normal muscle tone. Coordination normal.  Skin: Skin is warm and dry. No rash noted.  Psychiatric: She has a normal mood and affect.  Nursing note and vitals reviewed.    ED Treatments / Results  Labs (all labs ordered are listed, but only abnormal results are displayed) Labs Reviewed  CBC - Abnormal; Notable for the following components:      Result Value   Hemoglobin 15.1 (*)    HCT 46.4 (*)    All other components within normal limits  BASIC METABOLIC PANEL - Abnormal; Notable for the following components:   Glucose, Bld 153 (*)    Calcium 8.8 (*)    All other components within normal limits  URINALYSIS, ROUTINE W REFLEX MICROSCOPIC - Abnormal; Notable for the following components:   APPearance HAZY (*)    Ketones, ur 5 (*)    All other components within normal limits  CBG MONITORING, ED - Abnormal; Notable for the following components:   Glucose-Capillary 150 (*)    All other components within normal limits  I-STAT BETA HCG BLOOD, ED ([facility], [facility], [facility] ONLY)    EKG None  Radiology No results found.  Procedures Procedures (including critical care time)  Medications Ordered in ED Medications - No data to display   Initial Impression / Assessment and Plan / ED Course  I have reviewed the triage vital signs and the nursing notes.  Pertinent labs & imaging results that were available during my care of the patient were reviewed by me and considered in my medical decision making (see chart for details).     32 year old female presents to ED for evaluation of nausea, bilateral ankle swelling and fatigue.  She states that symptoms have been going on for 1 week.  Is concerned that she may be pregnant.  Last menstrual cycle was completed on 7/17.  Denies any chest pain, abdominal pain, injuries or falls, fever, vomiting or diarrhea.  States that the compression stockings help with her ankle swelling.  Swelling is worse when she stands a  lot for work.  On physical exam she is overall well-appearing.  Lungs are clear to auscultation bilaterally.  No lower extremity edema, erythema or calf tenderness noted on my exam.  Vital signs are reassuring.  CBC, BMP, urinalysis, hCG all unremarkable.  She is not pregnant.  5 ketones in urine shows that she may be mildly dehydrated.  She declines IV fluids.  She is comfortable with discharging home with PCP follow-up.  Advised to return to ED for any severe worsening symptoms.  Portions of this note were generated with Scientist, clinical (histocompatibility and immunogenetics)Dragon dictation software. Dictation errors may occur despite best attempts at proofreading.   Final Clinical Impressions(s) / ED Diagnoses   Final diagnoses:  Encounter for pregnancy test with result negative    ED Discharge Orders    None       Dietrich PatesKhatri, Antonisha Waskey, PA-C 01/31/18 1723    Tegeler, Canary Brimhristopher J, MD 02/01/18 [phone]

## 2018-09-13 ENCOUNTER — Emergency Department (HOSPITAL_COMMUNITY): Payer: Self-pay

## 2018-09-13 ENCOUNTER — Other Ambulatory Visit: Payer: Self-pay

## 2018-09-13 ENCOUNTER — Encounter (HOSPITAL_COMMUNITY): Payer: Self-pay | Admitting: Emergency Medicine

## 2018-09-13 ENCOUNTER — Emergency Department (HOSPITAL_COMMUNITY)
Admission: EM | Admit: 2018-09-13 | Discharge: 2018-09-13 | Disposition: A | Discharge status: Home / Self Care | Payer: Self-pay | Attending: Emergency Medicine | Admitting: Emergency Medicine

## 2018-09-13 DIAGNOSIS — J111 Influenza due to unidentified influenza virus with other respiratory manifestations: Secondary | ICD-10-CM | POA: Insufficient documentation

## 2018-09-13 DIAGNOSIS — R69 Illness, unspecified: Secondary | ICD-10-CM

## 2018-09-13 DIAGNOSIS — Z79899 Other long term (current) drug therapy: Secondary | ICD-10-CM | POA: Insufficient documentation

## 2018-09-13 DIAGNOSIS — F17200 Nicotine dependence, unspecified, uncomplicated: Secondary | ICD-10-CM | POA: Insufficient documentation

## 2018-09-13 LAB — CBC WITH DIFFERENTIAL/PLATELET
ABS IMMATURE GRANULOCYTES: 0.02 10*3/uL (ref 0.00–0.07)
Basophils Absolute: 0 10*3/uL (ref 0.0–0.1)
Basophils Relative: 0 %
Eosinophils Absolute: 0 10*3/uL (ref 0.0–0.5)
Eosinophils Relative: 0 %
HCT: 40.8 % (ref 36.0–46.0)
HEMOGLOBIN: 13.3 g/dL (ref 12.0–15.0)
IMMATURE GRANULOCYTES: 0 %
LYMPHS ABS: 1.2 10*3/uL (ref 0.7–4.0)
LYMPHS PCT: 21 %
MCH: 30.9 pg (ref 26.0–34.0)
MCHC: 32.6 g/dL (ref 30.0–36.0)
MCV: 94.7 fL (ref 80.0–100.0)
MONO ABS: 0.7 10*3/uL (ref 0.1–1.0)
MONOS PCT: 12 %
NEUTROS ABS: 4 10*3/uL (ref 1.7–7.7)
Neutrophils Relative %: 67 %
Platelets: 254 10*3/uL (ref 150–400)
RBC: 4.31 MIL/uL (ref 3.87–5.11)
RDW: 14 % (ref 11.5–15.5)
WBC: 6 10*3/uL (ref 4.0–10.5)
nRBC: 0 % (ref 0.0–0.2)

## 2018-09-13 LAB — BASIC METABOLIC PANEL
Anion gap: 7 (ref 5–15)
BUN: 9 mg/dL (ref 6–20)
CHLORIDE: 108 mmol/L (ref 98–111)
CO2: 24 mmol/L (ref 22–32)
Calcium: 9.1 mg/dL (ref 8.9–10.3)
Creatinine, Ser: 0.7 mg/dL (ref 0.44–1.00)
GFR calc Af Amer: >60 mL/min (ref >60)
GFR calc non Af Amer: >60 mL/min (ref >60)
GLUCOSE: 83 mg/dL (ref 70–99)
POTASSIUM: 3.8 mmol/L (ref 3.5–5.1)
Sodium: 139 mmol/L (ref 135–145)

## 2018-09-13 LAB — I-STAT BETA HCG BLOOD, ED (MC, WL, AP ONLY): I-stat hCG, quantitative: <5 m[IU]/mL (ref <5)

## 2018-09-13 MED ORDER — ONDANSETRON 4 MG PO TBDP
4.0000 mg | ORAL_TABLET | Freq: Once | ORAL | Status: AC | PRN
  Administered 2018-09-13: 4 mg via ORAL
  Filled 2018-09-13: qty 1

## 2018-09-13 MED ORDER — SODIUM CHLORIDE 0.9 % IV BOLUS
1000.0000 mL | Freq: Once | INTRAVENOUS | Status: AC
  Administered 2018-09-13: 1000 mL via INTRAVENOUS

## 2018-09-13 MED ORDER — ACETAMINOPHEN 325 MG PO TABS
650.0000 mg | ORAL_TABLET | Freq: Once | ORAL | Status: AC
  Administered 2018-09-13: 650 mg via ORAL
  Filled 2018-09-13: qty 2

## 2018-09-13 NOTE — ED Provider Notes (Signed)
[facility]-EMERGENCY DEPT Provider Note   CSN: [id number] Arrival date & time: 09/13/18  5035    History   Chief Complaint Chief Complaint  Patient presents with  . Cough  . Generalized Body Aches  . Nasal Congestion    HPI Martha West is a 33 y.o. female.     HPI  33 year old female presents with flulike symptoms.  She states that since 2/28 she is been having cough, mild sore throat, body aches.  She had vomiting last night.  She has been feeling weak and dizzy.  She went to work today and they sent her here given concern for the flu.  No diarrhea. No fever.  Past Medical History:  Diagnosis Date  . Chronic dental pain     Patient Active Problem List   Diagnosis Date Noted  . Chronic dental pain     History reviewed. No pertinent surgical history.   OB History   No obstetric history on file.      Home Medications    Prior to Admission medications   Medication Sig Start Date End Date Taking? Authorizing Provider  ibuprofen (ADVIL,MOTRIN) 200 MG tablet Take 400 mg by mouth every 6 (six) hours as needed for headache or moderate pain.   Yes [provider]  Multiple Vitamin (MULTIVITAMIN WITH MINERALS) TABS tablet Take 1 tablet by mouth daily.   Yes [provider]  HYDROcodone-acetaminophen (NORCO/VICODIN) 5-325 MG tablet Take 2 tablets by mouth every 6 (six) hours as needed for moderate pain. Patient not taking: Reported on 09/13/2018 06/12/15   Tharon Aquas, PA  naproxen (NAPROSYN) 500 MG tablet Take 1 tablet (500 mg total) by mouth 2 (two) times daily. Patient not taking: Reported on 09/13/2018 06/12/15   Tharon Aquas, PA  omeprazole (PRILOSEC) 40 MG capsule Take 1 capsule (40 mg total) by mouth daily. Patient not taking: Reported on 09/13/2018 09/16/15   Barbaraann Barthel, MD  promethazine (PHENERGAN) 25 MG tablet Take 1 tablet (25 mg total) by mouth every 6 (six) hours as needed for nausea or vomiting. Patient not  taking: Reported on 09/13/2018 09/25/14   Rodolph Bong, MD    Family History No family history on file.  Social History Social History   Tobacco Use  . Smoking status: Current Every Day Smoker  . Smokeless tobacco: Never Used  Substance Use Topics  . Alcohol use: Yes  . Drug use: No     Allergies   Patient has no known allergies.   Review of Systems Review of Systems  Constitutional: Negative for fever.  HENT: Positive for sore throat.   Respiratory: Positive for cough.   Gastrointestinal: Positive for vomiting.  Musculoskeletal: Positive for myalgias.  Neurological: Positive for light-headedness.  All other systems reviewed and are negative.    Physical Exam Updated Vital Signs BP (!) 156/89 (BP Location: Left Arm)   Pulse 70   Temp 98.6 F (37 C) (Oral)   Resp 18   LMP 08/21/2018   SpO2 97%   Physical Exam Vitals signs and nursing note reviewed.  Constitutional:      General: She is not in acute distress.    Appearance: She is well-developed. She is not ill-appearing or diaphoretic.  HENT:     Head: Normocephalic and atraumatic.     Right Ear: External ear normal.     Left Ear: External ear normal.     Nose: Nose normal.  Eyes:     General:  Right eye: No discharge.        Left eye: No discharge.  Cardiovascular:     Rate and Rhythm: Regular rhythm. Tachycardia present.     Heart sounds: Normal heart sounds.     Comments: HR~100 Pulmonary:     Effort: Pulmonary effort is normal.     Breath sounds: Normal breath sounds. No wheezing, rhonchi or rales.  Abdominal:     General: There is no distension.     Palpations: Abdomen is soft.     Tenderness: There is no abdominal tenderness.  Skin:    General: Skin is warm and dry.  Neurological:     Mental Status: She is alert.  Psychiatric:        Mood and Affect: Mood is not anxious.      ED Treatments / Results  Labs (all labs ordered are listed, but only abnormal results are  displayed) Labs Reviewed  BASIC METABOLIC PANEL  CBC WITH DIFFERENTIAL/PLATELET  I-STAT BETA HCG BLOOD, ED ([facility], [facility], [facility] ONLY)    EKG None  Radiology Dg Chest 2 View  Result Date: 09/13/2018 CLINICAL DATA:  Pt c/o body aches, cough that is non productive and congestion since Friday. EXAM: CHEST - 2 VIEW COMPARISON:  None. FINDINGS: The heart size and mediastinal contours are within normal limits. Both lungs are clear. The visualized skeletal structures are unremarkable. IMPRESSION: No active cardiopulmonary disease. Electronically Signed   By: Norva Pavlov M.D.   On: 09/13/2018 10:15    Procedures Procedures (including critical care time)  Medications Ordered in ED Medications  sodium chloride 0.9 % bolus 1,000 mL (1,000 mLs Intravenous New Bag/Given 09/13/18 0941)  acetaminophen (TYLENOL) tablet 650 mg (650 mg Oral Given 09/13/18 0948)  ondansetron (ZOFRAN-ODT) disintegrating tablet 4 mg (4 mg Oral Given 09/13/18 0929)     Initial Impression / Assessment and Plan / ED Course  I have reviewed the triage vital signs and the nursing notes.  Pertinent labs & imaging results that were available during my care of the patient were reviewed by me and considered in my medical decision making (see chart for details).        Given the patient's vomiting and lightheadedness, labs were obtained.  She is not pregnant and her labs are otherwise benign.  Chest x-ray without pneumonia.  This is most likely a mild flulike illness and we discussed supportive care.  Otherwise she appears stable for discharge home with return precautions.  Final Clinical Impressions(s) / ED Diagnoses   Final diagnoses:  Influenza-like illness    ED Discharge Orders    None       Pricilla Loveless, MD 09/13/18 1025

## 2018-09-13 NOTE — ED Triage Notes (Signed)
Pt c/o body aches, cough that is non productive and congestion since Friday.

## 2018-09-13 NOTE — ED Notes (Signed)
Bed: [facility] Expected date:  Expected time:  Means of arrival:  Comments: 

## 2018-09-13 NOTE — Discharge Instructions (Addendum)
If you develop high fever, vomiting, trouble breathing, coughing blood, or any other new/concerning symptoms then return to the ER for evaluation.

## 2019-05-23 ENCOUNTER — Encounter (HOSPITAL_COMMUNITY): Payer: Self-pay | Admitting: Emergency Medicine

## 2019-05-23 ENCOUNTER — Emergency Department (HOSPITAL_COMMUNITY)
Admission: EM | Admit: 2019-05-23 | Discharge: 2019-05-24 | Disposition: A | Discharge status: Home / Self Care | Payer: Medicaid Other | Attending: Emergency Medicine | Admitting: Emergency Medicine

## 2019-05-23 ENCOUNTER — Other Ambulatory Visit: Payer: Self-pay

## 2019-05-23 DIAGNOSIS — Z5321 Procedure and treatment not carried out due to patient leaving prior to being seen by health care provider: Secondary | ICD-10-CM | POA: Insufficient documentation

## 2019-05-23 DIAGNOSIS — R112 Nausea with vomiting, unspecified: Secondary | ICD-10-CM | POA: Insufficient documentation

## 2019-05-23 LAB — URINALYSIS, ROUTINE W REFLEX MICROSCOPIC
Bilirubin Urine: NEGATIVE
Glucose, UA: NEGATIVE mg/dL
Hgb urine dipstick: NEGATIVE
Ketones, ur: NEGATIVE mg/dL
Nitrite: POSITIVE — AB
Protein, ur: NEGATIVE mg/dL
Specific Gravity, Urine: 1.021 (ref 1.005–1.030)
pH: 6 (ref 5.0–8.0)

## 2019-05-23 LAB — COMPREHENSIVE METABOLIC PANEL
ALT: 15 U/L (ref 0–44)
AST: 20 U/L (ref 15–41)
Albumin: 3.8 g/dL (ref 3.5–5.0)
Alkaline Phosphatase: 62 U/L (ref 38–126)
Anion gap: 7 (ref 5–15)
BUN: 11 mg/dL (ref 6–20)
CO2: 28 mmol/L (ref 22–32)
Calcium: 9.4 mg/dL (ref 8.9–10.3)
Chloride: 107 mmol/L (ref 98–111)
Creatinine, Ser: 0.79 mg/dL (ref 0.44–1.00)
GFR calc Af Amer: >60 mL/min (ref >60)
GFR calc non Af Amer: >60 mL/min (ref >60)
Glucose, Bld: 86 mg/dL (ref 70–99)
Potassium: 4.2 mmol/L (ref 3.5–5.1)
Sodium: 142 mmol/L (ref 135–145)
Total Bilirubin: 0.7 mg/dL (ref 0.3–1.2)
Total Protein: 6.7 g/dL (ref 6.5–8.1)

## 2019-05-23 LAB — CBC
HCT: 45.2 % (ref 36.0–46.0)
Hemoglobin: 14.9 g/dL (ref 12.0–15.0)
MCH: 31.7 pg (ref 26.0–34.0)
MCHC: 33 g/dL (ref 30.0–36.0)
MCV: 96.2 fL (ref 80.0–100.0)
Platelets: 252 10*3/uL (ref 150–400)
RBC: 4.7 MIL/uL (ref 3.87–5.11)
RDW: 12.8 % (ref 11.5–15.5)
WBC: 7.5 10*3/uL (ref 4.0–10.5)
nRBC: 0 % (ref 0.0–0.2)

## 2019-05-23 LAB — I-STAT BETA HCG BLOOD, ED (MC, WL, AP ONLY): I-stat hCG, quantitative: <5 m[IU]/mL (ref <5)

## 2019-05-23 LAB — LIPASE, BLOOD: Lipase: 30 U/L (ref 11–51)

## 2019-05-23 MED ORDER — SODIUM CHLORIDE 0.9% FLUSH: 3.0000 mL | Freq: Once | INTRAVENOUS | Status: DC

## 2019-05-23 NOTE — ED Triage Notes (Signed)
Patient here from home with complaints of nausea, vomiting, headache x1 week. Reports that she needs a note stating that she cannot work because she is afraid of heights.

## 2019-06-05 ENCOUNTER — Encounter (HOSPITAL_COMMUNITY): Payer: Self-pay | Admitting: *Deleted

## 2019-06-05 ENCOUNTER — Other Ambulatory Visit: Payer: Self-pay

## 2019-06-05 ENCOUNTER — Ambulatory Visit (HOSPITAL_COMMUNITY)
Admission: EM | Admit: 2019-06-05 | Discharge: 2019-06-05 | Disposition: A | Discharge status: Home / Self Care | Payer: HRSA Program | Attending: Family Medicine | Admitting: Family Medicine

## 2019-06-05 DIAGNOSIS — Z20828 Contact with and (suspected) exposure to other viral communicable diseases: Secondary | ICD-10-CM | POA: Insufficient documentation

## 2019-06-05 DIAGNOSIS — R05 Cough: Secondary | ICD-10-CM | POA: Diagnosis present

## 2019-06-05 DIAGNOSIS — Z20822 Contact with and (suspected) exposure to covid-19: Secondary | ICD-10-CM

## 2019-06-05 DIAGNOSIS — F17200 Nicotine dependence, unspecified, uncomplicated: Secondary | ICD-10-CM

## 2019-06-05 DIAGNOSIS — R059 Cough, unspecified: Secondary | ICD-10-CM

## 2019-06-05 DIAGNOSIS — L720 Epidermal cyst: Secondary | ICD-10-CM | POA: Insufficient documentation

## 2019-06-05 MED ORDER — BENZONATATE 200 MG PO CAPS: 200.0000 mg | ORAL_CAPSULE | Freq: Two times a day (BID) | ORAL | 0 refills | Status: DC | PRN

## 2019-06-05 NOTE — ED Triage Notes (Signed)
C/O runny nose and productive cough with wheezing x 2 wks without any change.  Denies fevers, chills.  Denies hx asthma.  Also c/o "cyst" to right lateral breast.

## 2019-06-05 NOTE — Discharge Instructions (Signed)
Get plenty of rest Drink more water and fluids Take Tessalon 2 times a day for coughing In order for you to have a referral for your skin cyst to be removed, you must see a primary care doctor.  This is required by [employer].  Your Medicaid card should have your primary care doctor's name on it.

## 2019-06-05 NOTE — ED Provider Notes (Signed)
[facility]    CSN: [id number] Arrival date & time: 06/05/19  1628      History   Chief Complaint Chief Complaint  Patient presents with  . Cough  . Abscess    HPI Martha West is a 33 y.o. female.   HPI  Patient is here for 2 problems.  First she states she is had a cough cold and runny nose for 2 weeks.  The cough is severe.  Is keeping her awake at night.  She coughs so hard that she vomits.  Nonproductive cough.  No shortness of breath.  No history of asthma.  No sweats chills or fever.  No body aches.  She is tired from lack of sleep.  Her chest hurts from the coughing.  She is requesting coronavirus testing. She also complains about a "cyst" on her breast.  Is been present for years.  Sometimes it bothers her.  She states it only hurts when she "messes with it".  Past Medical History:  Diagnosis Date  . Chronic dental pain     Patient Active Problem List   Diagnosis Date Noted  . Chronic dental pain     Past Surgical History:  Procedure Laterality Date  . EYE SURGERY      OB History   No obstetric history on file.      Home Medications    Prior to Admission medications   Medication Sig Start Date End Date Taking? Authorizing Provider  benzonatate (TESSALON) 200 MG capsule Take 1 capsule (200 mg total) by mouth 2 (two) times daily as needed for cough. 06/05/19   Eustace MooreNelson, Tamana Hatfield Sue, MD  omeprazole (PRILOSEC) 40 MG capsule Take 1 capsule (40 mg total) by mouth daily. Patient not taking: Reported on 09/13/2018 09/16/15 06/05/19  Barbaraann BarthelBreen, James O, MD  promethazine (PHENERGAN) 25 MG tablet Take 1 tablet (25 mg total) by mouth every 6 (six) hours as needed for nausea or vomiting. Patient not taking: Reported on 09/13/2018 09/25/14 06/05/19  Rodolph Bongorey, Evan S, MD    Family History Family History  Problem Relation Age of Onset  . Cancer Mother   . HIV Mother   . Healthy Father     Social History Social History   Tobacco Use  . Smoking status:  Current Every Day Smoker    Types: Cigars  . Smokeless tobacco: Never Used  . Tobacco comment: Black & Milds  Substance Use Topics  . Alcohol use: Not Currently  . Drug use: Never     Allergies   Patient has no known allergies.   Review of Systems Review of Systems  Constitutional: Negative for chills and fever.  HENT: Positive for congestion, postnasal drip and rhinorrhea. Negative for ear pain and sore throat.   Eyes: Negative for pain and visual disturbance.  Respiratory: Positive for cough. Negative for shortness of breath.   Cardiovascular: Positive for chest pain. Negative for palpitations.  Gastrointestinal: Negative for abdominal pain and vomiting.  Genitourinary: Negative for dysuria and hematuria.  Musculoskeletal: Negative for arthralgias and back pain.  Skin: Negative for color change and rash.  Neurological: Negative for seizures and syncope.  All other systems reviewed and are negative.    Physical Exam Triage Vital Signs ED Triage Vitals  Enc Vitals Group     BP 06/05/19 1656 118/79     Pulse Rate 06/05/19 1656 88     Resp 06/05/19 1656 16     Temp 06/05/19 1656 98.8 F (37.1 C)  Temp Source 06/05/19 1656 Oral     SpO2 06/05/19 1656 98 %     Weight --      Height --      Head Circumference --      Peak Flow --      Pain Score 06/05/19 1657 10     Pain Loc --      Pain Edu? --      Excl. in [place]? --    No data found.  Updated Vital Signs BP 118/79   Pulse 88   Temp 98.8 F (37.1 C) (Oral)   Resp 16   LMP 06/04/2019 (Exact Date)   SpO2 98%      Physical Exam Constitutional:      General: She is not in acute distress.    Appearance: She is well-developed.  HENT:     Head: Normocephalic and atraumatic.     Right Ear: Tympanic membrane and ear canal normal.     Left Ear: Tympanic membrane and ear canal normal.     Nose: Congestion and rhinorrhea present.     Mouth/Throat:     Mouth: Mucous membranes are moist.     Pharynx: Posterior  oropharyngeal erythema present.  Eyes:     Conjunctiva/sclera: Conjunctivae normal.     Pupils: Pupils are equal, round, and reactive to light.  Neck:     Musculoskeletal: Normal range of motion and neck supple.  Cardiovascular:     Rate and Rhythm: Normal rate and regular rhythm.     Heart sounds: Normal heart sounds.  Pulmonary:     Effort: Pulmonary effort is normal. No respiratory distress.     Breath sounds: Normal breath sounds. No wheezing, rhonchi or rales.  Abdominal:     General: There is no distension.     Palpations: Abdomen is soft.  Musculoskeletal: Normal range of motion.  Lymphadenopathy:     Cervical: No cervical adenopathy.  Skin:    General: Skin is warm and dry.     Comments: Right breast has an epidermal inclusion cyst that measures 2 cm across.  Nontender.  Mobile.  Superficial  Neurological:     Mental Status: She is alert.  Psychiatric:        Mood and Affect: Mood normal.        Behavior: Behavior normal.      UC Treatments / Results  Labs (all labs ordered are listed, but only abnormal results are displayed) Labs Reviewed  SARS CORONAVIRUS 2 (TAT 6-24 HRS)    EKG   Radiology No results found.  Procedures Procedures (including critical care time)  Medications Ordered in UC Medications - No data to display  Initial Impression / Assessment and Plan / UC Course  I have reviewed the triage vital signs and the nursing notes.  Pertinent labs & imaging results that were available during my care of the patient were reviewed by me and considered in my medical decision making (see chart for details).  Clinical Course as of Jun 05 1819  Sun Jun 05, 2019  1715 SARS CORONAVIRUS 2 (TAT 6-24 HRS) Nasopharyngeal Nasopharyngeal Swab [YN]    Clinical Course User Index [YN] Raylene Everts, MD     Final Clinical Impressions(s) / UC Diagnoses   Final diagnoses:  Cough  Suspected COVID-19 virus infection  Epidermal inclusion cyst      Discharge Instructions     Get plenty of rest Drink more water and fluids Take Tessalon 2 times a day for  coughing In order for you to have a referral for your skin cyst to be removed, you must see a primary care doctor.  This is required by [employer].  Your Medicaid card should have your primary care doctor's name on it.   ED Prescriptions    Medication Sig Dispense Auth. Provider   benzonatate (TESSALON) 200 MG capsule Take 1 capsule (200 mg total) by mouth 2 (two) times daily as needed for cough. 20 capsule Eustace Moore, MD     PDMP not reviewed this encounter.   Eustace Moore, MD 06/05/19 [phone]

## 2019-06-06 LAB — NOVEL CORONAVIRUS, NAA (HOSP ORDER, SEND-OUT TO REF LAB; TAT 18-24 HRS): SARS-CoV-2, NAA: NOT DETECTED

## 2019-07-19 ENCOUNTER — Encounter (HOSPITAL_COMMUNITY): Payer: Self-pay

## 2019-07-19 ENCOUNTER — Other Ambulatory Visit: Payer: Self-pay

## 2019-07-19 ENCOUNTER — Ambulatory Visit (HOSPITAL_COMMUNITY)
Admission: EM | Admit: 2019-07-19 | Discharge: 2019-07-19 | Disposition: A | Discharge status: Home / Self Care | Payer: Self-pay | Attending: Family Medicine | Admitting: Family Medicine

## 2019-07-19 DIAGNOSIS — M542 Cervicalgia: Secondary | ICD-10-CM

## 2019-07-19 DIAGNOSIS — M62830 Muscle spasm of back: Secondary | ICD-10-CM

## 2019-07-19 DIAGNOSIS — G44209 Tension-type headache, unspecified, not intractable: Secondary | ICD-10-CM

## 2019-07-19 MED ORDER — TIZANIDINE HCL 4 MG PO TABS: 4.0000 mg | ORAL_TABLET | Freq: Four times a day (QID) | ORAL | 0 refills | Status: AC | PRN

## 2019-07-19 MED ORDER — IBUPROFEN 800 MG PO TABS: 800.0000 mg | ORAL_TABLET | Freq: Three times a day (TID) | ORAL | 0 refills | Status: AC
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
## 2019-07-19 NOTE — Discharge Instructions (Signed)
Rest.  Activity as tolerated.  Avoid bending, lifting, or movements that cause pain Use ice or heat to painful neck and back muscles Take ibuprofen 3 times a day with food Take tizanidine as needed as a muscle relaxer.  This is useful at bedtime Call or return if unable to return to work by Friday

## 2020-01-07 IMAGING — CR DG CHEST 2V
2 series · 2 of 2 positions shown · non-contrast
Comparison: None.

CLINICAL DATA: Pt c/o body aches, cough that is non productive and
congestion since [REDACTED].

EXAM:
CHEST - 2 VIEW

[w chest pa]
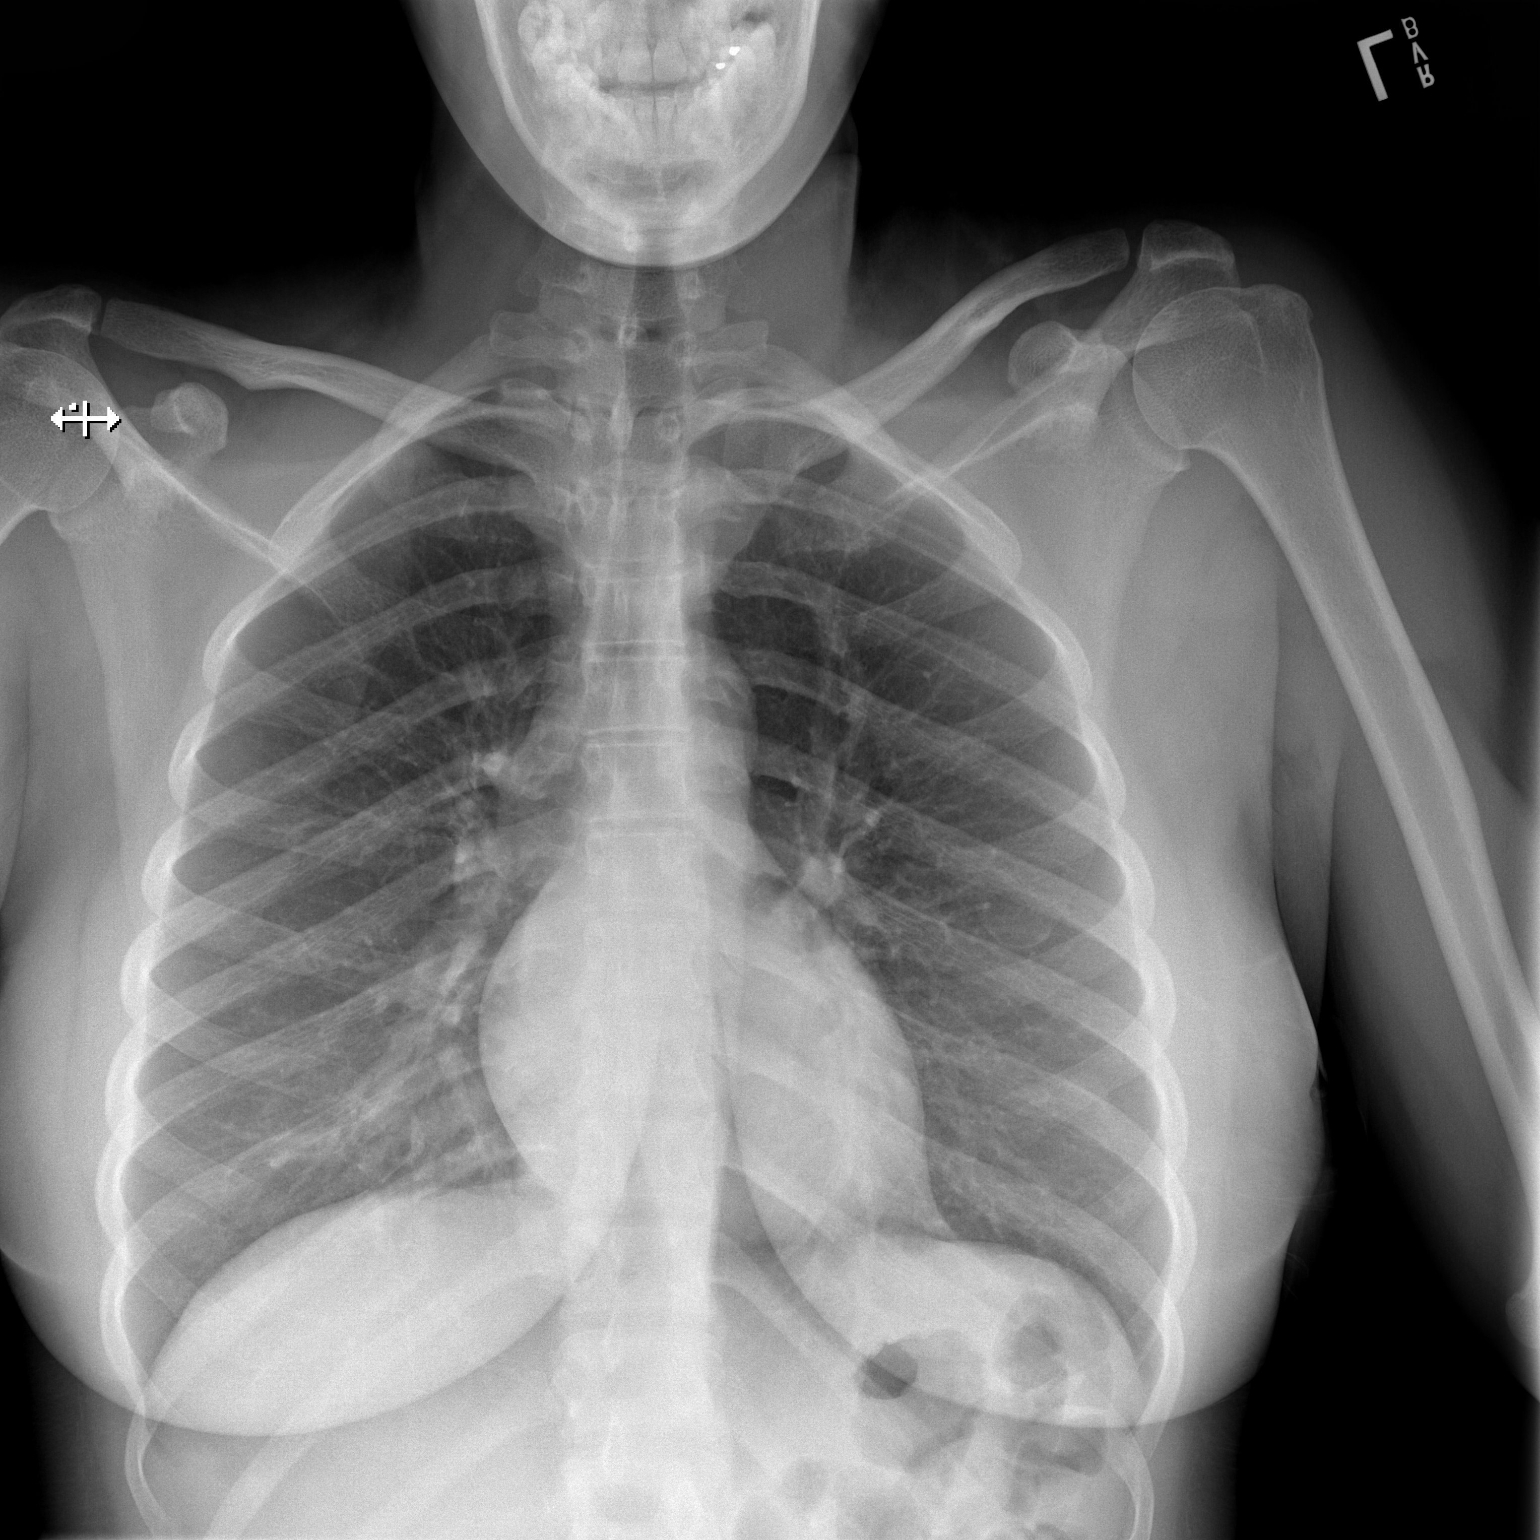

[w chest lat]
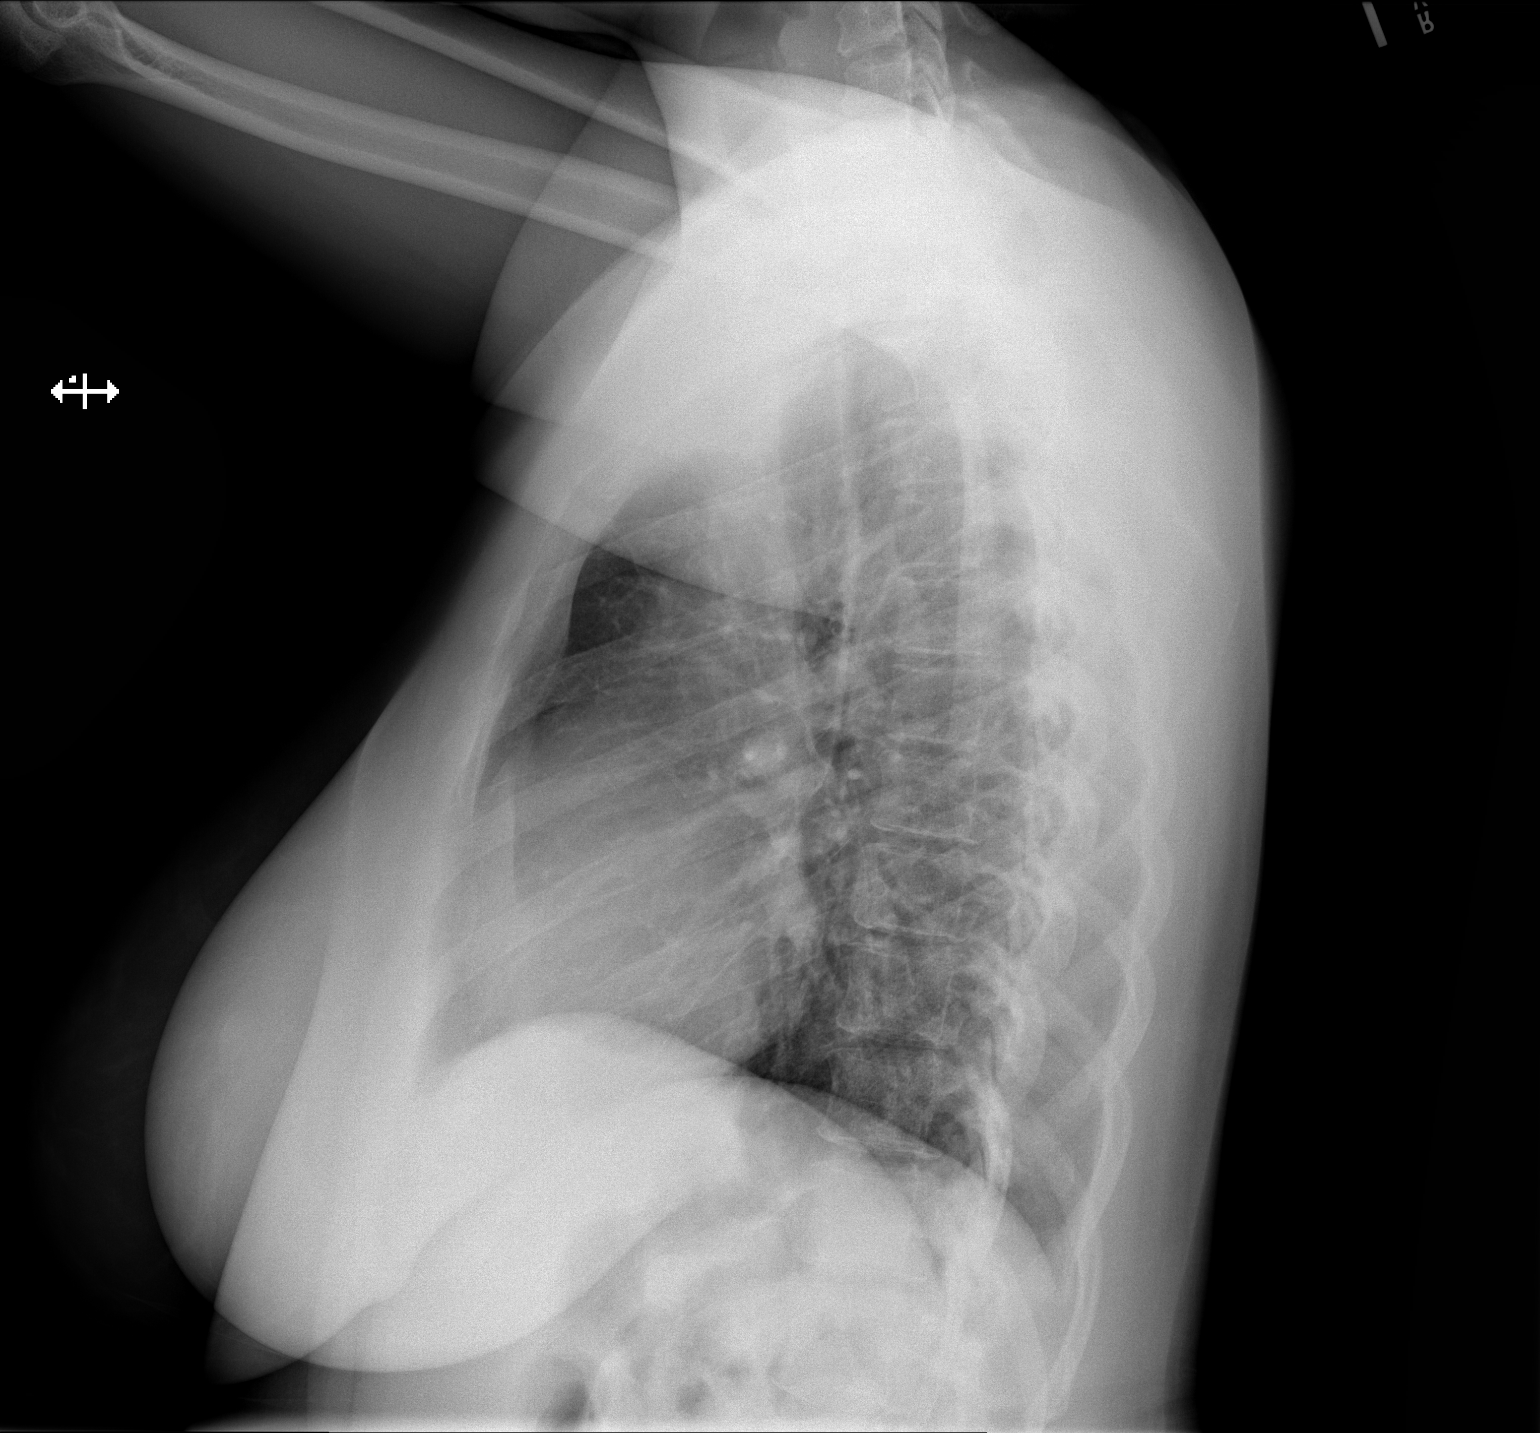

[2 of 2 positions shown; findings below may reference images not displayed]

FINDINGS: The heart size and mediastinal contours are within normal limits.
Both lungs are clear. The visualized skeletal structures are
unremarkable.
IMPRESSION: No active cardiopulmonary disease.

## 2020-04-12 ENCOUNTER — Ambulatory Visit (HOSPITAL_COMMUNITY)
Admission: EM | Admit: 2020-04-12 | Discharge: 2020-04-12 | Disposition: A | Discharge status: Home / Self Care | Payer: No Payment, Other | Attending: Psychiatry | Admitting: Psychiatry

## 2020-04-12 ENCOUNTER — Other Ambulatory Visit: Payer: Self-pay

## 2020-04-12 DIAGNOSIS — R45851 Suicidal ideations: Secondary | ICD-10-CM | POA: Diagnosis not present

## 2020-04-12 DIAGNOSIS — Z915 Personal history of self-harm: Secondary | ICD-10-CM | POA: Insufficient documentation

## 2020-04-12 DIAGNOSIS — F331 Major depressive disorder, recurrent, moderate: Secondary | ICD-10-CM | POA: Insufficient documentation

## 2020-04-12 DIAGNOSIS — F1010 Alcohol abuse, uncomplicated: Secondary | ICD-10-CM | POA: Insufficient documentation

## 2020-04-12 DIAGNOSIS — F332 Major depressive disorder, recurrent severe without psychotic features: Secondary | ICD-10-CM | POA: Insufficient documentation
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
## 2020-04-12 NOTE — BH Assessment (Signed)
Comprehensive Clinical Assessment (CCA) Note  04/12/2020 Martha West [id number]   Patient states that she has been depressed lately and having vague suicidal plans with no intent or plan.  Patient states that she has been drinking daily to self-medicate her emotions.  Patient states that she has been stressed out financially which is exacerbating her depression.  Patient states that she has been on medications for depression prescribed by her PCP, but states that she has not been on medication in awhile and states that she feels like she could benefit from being on medications.  Patient denies current SI, but states that she has been experiencing passive suicidal thoughts, but has no plan or intent and states that she has never attempted suicide in the past.  Patient denies HI/Psychosis.  Patient states that she has not been sleeping or eating well recently.  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  Patient states that she is single, lives alone and has no children.  Patient is employed by [employer].  Patient presents as alert and oriented.  Her mood is depressed and her affect flat.  Her judgment, insight and impulse control are mildly impaired.  Her thoughts are organized and her memory intact.  She does not appear to be responding to any internal stimuli.    Visit Diagnosis:      ICD-10-CM   1. Moderate episode of recurrent major depressive disorder (HCC)  F33.1       CCA Screening, Triage and Referral (STR)  Patient Reported Information How did you hear about [place]? Self  Referral name: No data recorded Referral phone number: No data recorded  Whom do you see for routine medical problems? No data recorded Practice/Facility Name: No data recorded Practice/Facility Phone Number: No data recorded Name of Contact: No data recorded Contact Number: No data recorded Contact Fax Number: No data  recorded Prescriber Name: No data recorded Prescriber Address (if known): No data recorded  What Is the Reason for Your Visit/Call Today? Patient states that she has been depressed with passive suicidal thoughts.  States that she has been self medicating her depression for the past year with alcohol  How Long Has This Been Causing You Problems? > than 6 months  What Do You Feel Would Help You the Most Today? Medication   Have You Recently Been in Any Inpatient Treatment (Hospital/Detox/Crisis Center/28-Day Program)? No  Name/Location of Program/Hospital:No data recorded How Long Were You There? No data recorded When Were You Discharged? No data recorded  Have You Ever Received Services From [facility] Before? No  Who Do You See at [facility]? No data recorded  Have You Recently Had Any Thoughts About Hurting Yourself? Yes (did not have a plan or any intent)  Are You Planning to Commit Suicide/Harm Yourself At This time? No   Have you Recently Had Thoughts About Hurting Someone Karolee Ohs? No  Explanation: No data recorded  Have You Used Any Alcohol or Drugs in the Past 24 Hours? Yes  How Long Ago Did You Use Drugs or Alcohol? No data recorded What Did You Use and How Much? patient states that she has been drinking 3-4 mixed drinks for the past year   Do You Currently Have a Therapist/Psychiatrist? No  Name of Therapist/Psychiatrist: No data recorded  Have You Been Recently Discharged From Any Office Practice or Programs? No  Explanation of Discharge From Practice/Program: No data recorded  CCA Screening Triage Referral Assessment Type of Contact: Face-to-Face  Is this Initial or Reassessment? No data recorded Date Telepsych consult ordered in [facility]:  No data recorded Time Telepsych consult ordered in [facility]:  No data recorded  Patient Reported Information Reviewed? Yes  Patient Left Without Being Seen? No data recorded Reason for Not Completing Assessment: No data  recorded  Collateral Involvement: No collateral information available   Does Patient Have a Court Appointed Legal Guardian? No data recorded Name and Contact of Legal Guardian: No data recorded If Minor and Not Living with Parent(s), Who has Custody? No data recorded Is CPS involved or ever been involved? Never  Is APS involved or ever been involved? Never   Patient Determined To Be At Risk for Harm To Self or Others Based on Review of Patient Reported Information or Presenting Complaint? No (patient states that she came to get help for herself prior to causing any self-harm to herself.)  Method: No data recorded Availability of Means: No data recorded Intent: No data recorded Notification Required: No data recorded Additional Information for Danger to Others Potential: No data recorded Additional Comments for Danger to Others Potential: No data recorded Are There Guns or Other Weapons in Your Home? No data recorded Types of Guns/Weapons: No data recorded Are These Weapons Safely Secured?                            No data recorded Who Could Verify You Are Able To Have These Secured: No data recorded Do You Have any Outstanding Charges, Pending Court Dates, Parole/Probation? No data recorded Contacted To Inform of Risk of Harm To Self or Others: No data recorded  [facility]   Does Patient Present under Involuntary Commitment? No  IVC Papers Initial File Date: No data recorded  [place]   Patient Currently Receiving the Following Services: Not Receiving Services   Determination of Need: Routine (7 days)   Options For Referral: Medication Management;Outpatient Therapy     CCA Biopsychosocial  Intake/Chief Complaint:  CCA Intake With Chief Complaint CCA Part Two Date: 04/12/20 CCA Part Two Time: 1838 Patient's Currently Reported Symptoms/Problems: Patient states that she has been depressed lately and  having vague suicidal plans with no intent or plan.  Patient states that she has been drinking daily to self-medicate her emotions.  Patient states that she has been stressed out financially which is exacerbating her depression.  Patient states that she has been on medications for depression prescribed by her PCP, but states that she has not been on medication in awhile and states that she feels like she could benefit from being on medications. Individual's Strengths: Patient states that she likes to help others Individual's Preferences: Patient has no preferences that require hospitalization Individual's Abilities: Patient states that she is good at cleaning Type of Services Patient Feels Are Needed: Medication Management and therapy  Mental Health Symptoms Depression:  Depression: Change in energy/activity, Sleep (too much or little), Duration of symptoms less than two weeks, Hopelessness  Mania:  Mania: None  Anxiety:   Anxiety: None  Psychosis:  Psychosis: None  Trauma:  Trauma: None  Obsessions:  Obsessions: None  Compulsions:  Compulsions: None  Inattention:  Inattention: None  Hyperactivity/Impulsivity:  Hyperactivity/Impulsivity: N/A  Oppositional/Defiant Behaviors:  Oppositional/Defiant Behaviors: None  Emotional Irregularity:  Emotional Irregularity: None  Other Mood/Personality Symptoms:      Mental Status Exam Appearance  and self-care  Stature:  Stature: Average  Weight:  Weight: Average weight  Clothing:  Clothing: Neat/clean, Casual  Grooming:  Grooming: Well-groomed  Cosmetic use:  Cosmetic Use: Age appropriate  Posture/gait:  Posture/Gait: Normal  Motor activity:  Motor Activity: Not Remarkable  Sensorium  Attention:  Attention: Normal  Concentration:  Concentration: Normal  Orientation:  Orientation: Object, Person, Place, Situation, Time  Recall/memory:  Recall/Memory: Normal  Affect and Mood  Affect:  Affect: Depressed, Flat  Mood:  Mood: Depressed  Relating   Eye contact:  Eye Contact: Normal  Facial expression:  Facial Expression: Depressed, Sad  Attitude toward examiner:  Attitude Toward Examiner: Cooperative  Thought and Language  Speech flow: Speech Flow: Clear and Coherent  Thought content:  Thought Content: Appropriate to Mood and Circumstances  Preoccupation:  Preoccupations: None  Hallucinations:  Hallucinations: None  Organization:     Company secretary of Knowledge:  Fund of Knowledge: Good  Intelligence:  Intelligence: Average  Abstraction:  Abstraction: Normal  Judgement:  Judgement: Fair  Dance movement psychotherapist:  Reality Testing: Adequate, Realistic  Insight:  Insight: Fair  Decision Making:  Decision Making: Normal  Social Functioning  Social Maturity:  Social Maturity: Isolates  Social Judgement:  Social Judgement: Normal  Stress  Stressors:  Stressors: Doctor, hospital Ability:  Coping Ability: Normal  Skill Deficits:  Skill Deficits: None  Supports:  Supports: Family     Religion: Religion/Spirituality Are You A Religious Person?: No  Leisure/Recreation: Leisure / Recreation Do You Have Hobbies?: Yes Leisure and Hobbies: Glass blower/designer and cooking  Exercise/Diet: Exercise/Diet Do You Exercise?: No Have You Gained or Lost A Significant Amount of Weight in the Past Six Months?: No Do You Follow a Special Diet?: No Do You Have Any Trouble Sleeping?: No   CCA Employment/Education  Employment/Work Situation: Employment / Work Situation Employment situation: Employed Where is patient currently employed?: [employer] long has patient been employed?: 3 years Patient's job has been impacted by current illness: No What is the longest time patient has a held a job?: 4 years Where was the patient employed at that time?: [employer] Has patient ever been in the [employer]?: No  Education: Education Is Patient Currently Attending School?: No Last Grade Completed: 12 Name of High School: [employer]  [place] Did Garment/textile technologist From [employer]?: Yes Did Theme park manager?: No Did Designer, television/film set?: No Did You Have An Individualized Education Program (IIEP): No Did You Have Any Difficulty At [employer]?: No Patient's Education Has Been Impacted by Current Illness: No   CCA Family/Childhood History  Family and Relationship History: Family history Marital status: Single Are you sexually active?: No What is your sexual orientation?: heterosexual Has your sexual activity been affected by drugs, alcohol, medication, or emotional stress?: none reported Does patient have children?: No  Childhood History:  Childhood History By whom was/is the patient raised?: Mother Description of patient's relationship with caregiver when they were a child: patient states that she was close to her mother growing up Patient's description of current relationship with people who raised him/her: Mother has passed away How were you disciplined when you got in trouble as a child/adolescent?: patient states that she was disciplined appropriately Does patient have siblings?: Yes Number of Siblings: 4 Description of patient's current relationship with siblings: patient states that she has a close relationship with her siblings Did patient suffer any verbal/emotional/physical/sexual abuse as a child?: No Did patient suffer from severe childhood neglect?: No  Has patient ever been sexually abused/assaulted/raped as an adolescent or adult?: No Was the patient ever a victim of a crime or a disaster?: No Witnessed domestic violence?: No Has patient been affected by domestic violence as an adult?: No  Child/Adolescent Assessment:     CCA Substance Use  Alcohol/Drug Use: Alcohol / Drug Use Pain Medications: see MAR Prescriptions: See MAR Over the Counter: see MAR History of alcohol / drug use?: Yes Longest period of sobriety (when/how long): none reported Substance #1 Name of Substance 1: alcohol 1  - Age of First Use: 23 1 - Amount (size/oz): 3-4 mixed drinks 1 - Frequency: daily 1 - Duration: for the past year 1 - Last Use / Amount: Last pm                       ASAM's:  Six Dimensions of Multidimensional Assessment  Dimension 1:  Acute Intoxication and/or Withdrawal Potential:   Dimension 1:  Description of individual's past and current experiences of substance use and withdrawal: Patient has no current withdrawal symptoms  Dimension 2:  Biomedical Conditions and Complications:   Dimension 2:  Description of patient's biomedical conditions and  complications: Patient has no medical conditions exacerbated by her alcohol use.  Dimension 3:  Emotional, Behavioral, or Cognitive Conditions and Complications:  Dimension 3:  Description of emotional, behavioral, or cognitive conditions and complications: Patient states that she drinks alcohol to self-medicate her depression  Dimension 4:  Readiness to Change:  Dimension 4:  Description of Readiness to Change criteria: Patient states that she is ready to get help for her depression and her alcohol problem  Dimension 5:  Relapse, Continued use, or Continued Problem Potential:  Dimension 5:  Relapse, continued use, or continued problem potential critiera description: Patient has no history of significant sober time  Dimension 6:  Recovery/Living Environment:  Dimension 6:  Recovery/Iiving environment criteria description: Patient lives alone in a safe and supportive environment  ASAM Severity Score: ASAM's Severity Rating Score: 8  ASAM Recommended Level of Treatment: ASAM Recommended Level of Treatment: Level II Intensive Outpatient Treatment   Substance use Disorder (SUD) Substance Use Disorder (SUD)  Checklist Symptoms of Substance Use: Continued use despite persistent or recurrent social, interpersonal problems, caused or exacerbated by use, Continued use despite having a persistent/recurrent physical/psychological problem  caused/exacerbated by use, Presence of craving or strong urge to use, Social, occupational, recreational activities given up or reduced due to use, Substance(s) often taken in larger amounts or over longer times than was intended  Recommendations for Services/Supports/Treatments: Recommendations for Services/Supports/Treatments Recommendations For Services/Supports/Treatments: CD-IOP Intensive Chemical Dependency Program  DSM5 Diagnoses: Patient Active Problem List   Diagnosis Date Noted  . Alcohol use disorder, mild, abuse   . Severe episode of recurrent major depressive disorder, without psychotic features (HCC)   . Chronic dental pain     Disposition:  Per Berneice Heinrich, NP, patient has been psych cleared for discharge to follow-up with an OP Provider  Referrals to Alternative Service(s): Referred to Alternative Service(s):   Place:   Date:   Time:    Referred to Alternative Service(s):   Place:   Date:   Time:    Referred to Alternative Service(s):   Place:   Date:   Time:    Referred to Alternative Service(s):   Place:   Date:   Time:     Arnoldo Lenis Sonnia Strong

## 2020-04-12 NOTE — Discharge Instructions (Addendum)

## 2020-04-12 NOTE — Care Management (Signed)
Patient wants to receive services with a psychiatrist and a therapist on an outpatient basis.  Patient was linked to [facility] outpatient walk in clinic.    Patient denies SI/HI/Psychosis/Substance Abuse.  Patient denies MSE Exam.

## 2020-06-21 ENCOUNTER — Encounter (HOSPITAL_COMMUNITY): Payer: Self-pay | Admitting: *Deleted

## 2020-06-21 ENCOUNTER — Other Ambulatory Visit: Payer: Self-pay

## 2020-06-21 ENCOUNTER — Emergency Department (HOSPITAL_COMMUNITY)
Admission: EM | Admit: 2020-06-21 | Discharge: 2020-06-21 | Disposition: A | Discharge status: Home / Self Care | Payer: Medicaid Other | Attending: Emergency Medicine | Admitting: Emergency Medicine

## 2020-06-21 DIAGNOSIS — J020 Streptococcal pharyngitis: Secondary | ICD-10-CM | POA: Insufficient documentation

## 2020-06-21 DIAGNOSIS — F17290 Nicotine dependence, other tobacco product, uncomplicated: Secondary | ICD-10-CM | POA: Insufficient documentation

## 2020-06-21 LAB — GROUP A STREP BY PCR: Group A Strep by PCR: DETECTED — AB

## 2020-06-21 MED ORDER — KETOROLAC TROMETHAMINE 10 MG PO TABS
10.0000 mg | ORAL_TABLET | Freq: Once | ORAL | Status: AC
  Administered 2020-06-21: 10 mg via ORAL
  Filled 2020-06-21: qty 1

## 2020-06-21 MED ORDER — DEXAMETHASONE 4 MG PO TABS
10.0000 mg | ORAL_TABLET | Freq: Once | ORAL | Status: AC
  Administered 2020-06-21: 10 mg via ORAL
  Filled 2020-06-21: qty 3

## 2020-06-21 MED ORDER — PENICILLIN G BENZATHINE 1200000 UNIT/2ML IM SUSP
1.2000 10*6.[IU] | Freq: Once | INTRAMUSCULAR | Status: AC
  Administered 2020-06-21: 1.2 10*6.[IU] via INTRAMUSCULAR
  Filled 2020-06-21: qty 2

## 2020-06-21 NOTE — ED Provider Notes (Signed)
[facility] EMERGENCY DEPARTMENT Provider Note   CSN: [id number] Arrival date & time: 06/21/20  0005     History Chief Complaint  Patient presents with  . Sore Throat    Martha West is a 34 y.o. female.   The history is provided by the patient. No language interpreter was used.  Sore Throat This is a new problem. The current episode started 2 days ago. The problem occurs constantly. The problem has been gradually worsening. The symptoms are aggravated by swallowing, eating and drinking. Nothing relieves the symptoms. Treatments tried: Excedrin. The treatment provided mild relief.      Past Medical History:  Diagnosis Date  . Chronic dental pain     Patient Active Problem List   Diagnosis Date Noted  . Alcohol use disorder, mild, abuse   . Severe episode of recurrent major depressive disorder, without psychotic features (HCC)   . Chronic dental pain     Past Surgical History:  Procedure Laterality Date  . EYE SURGERY       OB History   No obstetric history on file.     Family History  Problem Relation Age of Onset  . Cancer Mother   . HIV Mother   . Healthy Father     Social History   Tobacco Use  . Smoking status: Current Every Day Smoker    Types: Cigars  . Smokeless tobacco: Never Used  . Tobacco comment: Black & Milds  Vaping Use  . Vaping Use: Never used  Substance Use Topics  . Alcohol use: Not Currently  . Drug use: Never    Home Medications Prior to Admission medications   Medication Sig Start Date End Date Taking? Authorizing Provider  ibuprofen (ADVIL) 800 MG tablet Take 1 tablet (800 mg total) by mouth 3 (three) times daily. 07/19/19   Eustace Moore, MD  tiZANidine (ZANAFLEX) 4 MG tablet Take 1-2 tablets (4-8 mg total) by mouth every 6 (six) hours as needed for muscle spasms. 07/19/19   Eustace Moore, MD  omeprazole (PRILOSEC) 40 MG capsule Take 1 capsule (40 mg total) by mouth daily. Patient not taking:  Reported on 09/13/2018 09/16/15 06/05/19  Barbaraann Barthel, MD  promethazine (PHENERGAN) 25 MG tablet Take 1 tablet (25 mg total) by mouth every 6 (six) hours as needed for nausea or vomiting. Patient not taking: Reported on 09/13/2018 09/25/14 06/05/19  Rodolph Bong, MD    Allergies    Patient has no known allergies.  Review of Systems   Review of Systems  Ten systems reviewed and are negative for acute change, except as noted in the HPI.    Physical Exam Updated Vital Signs BP 118/88   Pulse 95   Temp 98.6 F (37 C) (Oral)   Resp 18   Ht 5\' 4"  (1.626 m)   Wt 80.7 kg   LMP 06/19/2020   SpO2 98%   BMI 30.54 kg/m   Physical Exam Vitals and nursing note reviewed.  Constitutional:      General: She is not in acute distress.    Appearance: She is well-developed and well-nourished. She is not diaphoretic.     Comments: Nontoxic and in no acute distress  HENT:     Head: Normocephalic and atraumatic.     Mouth/Throat:     Mouth: Mucous membranes are moist.     Pharynx: Uvula midline. Pharyngeal swelling and posterior oropharyngeal erythema present.     Tonsils: Tonsillar exudate present. No  tonsillar abscesses.     Comments: Tolerating secretions.  Normal phonation. Eyes:     General: No scleral icterus.    Extraocular Movements: EOM normal.     Conjunctiva/sclera: Conjunctivae normal.  Neck:     Comments: No meningismus Pulmonary:     Effort: Pulmonary effort is normal. No respiratory distress.  Musculoskeletal:        General: Normal range of motion.     Cervical back: Normal range of motion.  Skin:    General: Skin is warm and dry.     Coloration: Skin is not pale.     Findings: No erythema or rash.  Neurological:     Mental Status: She is alert and oriented to person, place, and time.  Psychiatric:        Mood and Affect: Mood and affect normal.        Behavior: Behavior normal.     ED Results / Procedures / Treatments   Labs (all labs ordered are listed, but  only abnormal results are displayed) Labs Reviewed  GROUP A STREP BY PCR - Abnormal; Notable for the following components:      Result Value   Group A Strep by PCR DETECTED (*)    All other components within normal limits    EKG None  Radiology No results found.  Procedures Procedures (including critical care time)  Medications Ordered in ED Medications  penicillin g benzathine (BICILLIN LA) 1200000 UNIT/2ML injection 1.2 Million Units (has no administration in time range)  ketorolac (TORADOL) tablet 10 mg (has no administration in time range)  dexamethasone (DECADRON) tablet 10 mg (has no administration in time range)    ED Course  I have reviewed the triage vital signs and the nursing notes.  Pertinent labs & imaging results that were available during my care of the patient were reviewed by me and considered in my medical decision making (see chart for details).    MDM Rules/Calculators/A&P                          34 year old female presenting for sore throat x2 days.  Work-up in the ED consistent with strep pharyngitis.  She is tolerating secretions without difficulty or drooling.  No voice muffling.  No meningismus.  Soft oral floor.  Given IM Bicillin prior to discharge.  Counseled on salt water gargles and ibuprofen for pain control.  Return precautions discussed and provided. Patient discharged in stable condition with no unaddressed concerns.   Final Clinical Impression(s) / ED Diagnoses Final diagnoses:  Strep throat    Rx / DC Orders ED Discharge Orders    None       Antony Madura, PA-C 06/21/20 0435    Alvira Monday, MD 06/21/20 2217

## 2020-06-21 NOTE — ED Triage Notes (Signed)
The pt is c/o a sore throat for 2 days no temp  She reports that she has growths in the back of her throat  lmp yesterday

## 2020-06-21 NOTE — Discharge Instructions (Addendum)
Take 600 mg ibuprofen every 6 hours for management of pain.  We recommend salt water gargles 3-4 times per day.  You do not need any additional antibiotics as you were given a shot of Bicillin prior to discharge.  Return to the ED for new or concerning symptoms. 

## 2020-06-21 NOTE — ED Notes (Signed)
Patient verbalizes understanding of discharge instructions. Opportunity for questioning and answers were provided. Armband removed by staff, pt discharged from ED and ambulated to lobby to return home.   

## 2021-08-10 ENCOUNTER — Emergency Department (HOSPITAL_COMMUNITY)
Admission: EM | Admit: 2021-08-10 | Discharge: 2021-08-10 | Disposition: A | Discharge status: Home / Self Care | Payer: Medicaid Other | Attending: Emergency Medicine | Admitting: Emergency Medicine

## 2021-08-10 DIAGNOSIS — S0181XA Laceration without foreign body of other part of head, initial encounter: Secondary | ICD-10-CM | POA: Insufficient documentation

## 2021-08-10 MED ORDER — OXYCODONE-ACETAMINOPHEN 5-325 MG PO TABS
1.0000 | ORAL_TABLET | ORAL | Status: DC | PRN
  Administered 2021-08-10: 1 via ORAL
  Filled 2021-08-10: qty 1

## 2021-08-10 MED ORDER — LIDOCAINE HCL (PF) 1 % IJ SOLN
10.0000 mL | Freq: Once | INTRAMUSCULAR | Status: DC
  Filled 2021-08-10: qty 10

## 2021-08-10 MED ORDER — LIDOCAINE-EPINEPHRINE-TETRACAINE (LET) TOPICAL GEL
3.0000 mL | Freq: Once | TOPICAL | Status: AC
  Administered 2021-08-10: 3 mL via TOPICAL
  Filled 2021-08-10: qty 3

## 2021-08-10 MED ORDER — NAPROXEN 500 MG PO TABS: 500.0000 mg | ORAL_TABLET | Freq: Two times a day (BID) | ORAL | 0 refills | Status: AC | PRN

## 2021-08-10 MED ORDER — NAPROXEN 250 MG PO TABS
500.0000 mg | ORAL_TABLET | Freq: Once | ORAL | Status: AC
  Administered 2021-08-10: 500 mg via ORAL
  Filled 2021-08-10: qty 2
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
## 2021-08-10 NOTE — Discharge Instructions (Addendum)
You were seen in the emergency department today for a laceration to your chin. Your laceration was closed with 5 stitches to the outside and 2 stitches on the inside. Please keep this area clean and dry for the next 24 hours, after 24 hours you may get this area wet, but avoid soaking the area. Keep the area covered as best possible especially when in the sun to help in minimizing scarring.   The stitches are absorbable, they will come out on their own.  Please do not put any ointments/lotions on the stitches for the next 5 days.  Please keep the wound covered when out in the sun as this will reduce risk of scarring.  - Naproxen- this is a nonsteroidal anti-inflammatory medication that will help with pain and swelling. Be sure to take this medication as prescribed with food, 1 pill every 12 hours,  It should be taken with food, as it can cause stomach upset, and more seriously, stomach bleeding. Do not take other nonsteroidal anti-inflammatory medications with this such as Advil, Motrin, Aleve, Mobic, Goodie Powder, or Motrin etc..    You make take Tylenol per over the counter dosing with these medications.   We have prescribed you new medication(s) today. Discuss the medications prescribed today with your pharmacist as they can have adverse effects and interactions with your other medicines including over the counter and prescribed medications. Seek medical evaluation if you start to experience new or abnormal symptoms after taking one of these medicines, seek care immediately if you start to experience difficulty breathing, feeling of your throat closing, facial swelling, or rash as these could be indications of a more serious allergic reaction   Follow-up with your primary care provider within 3 to 5 days for wound recheck.  Return to the ER soon should you start to experience pus type drainage from the wound, redness around the wound, or fevers as this could indicate the area is infected, please  return to the ER for any other worsening symptoms or concerns that you may have.

## 2021-08-10 NOTE — ED Notes (Signed)
ED provider at bedside for suture process °
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
## 2021-08-10 NOTE — ED Notes (Signed)
E-signature pad unavailable at time of pt discharge. This RN discussed discharge materials with pt and answered all pt questions. Pt stated understanding of discharge material. ? ?

## 2023-04-19 ENCOUNTER — Other Ambulatory Visit: Payer: Self-pay

## 2023-04-19 ENCOUNTER — Inpatient Hospital Stay (HOSPITAL_COMMUNITY)
Admission: AD | Admit: 2023-04-19 | Discharge: 2023-04-20 | Disposition: A | Discharge status: Home / Self Care | Payer: Medicaid Other | Attending: Obstetrics and Gynecology | Admitting: Obstetrics and Gynecology

## 2023-04-19 DIAGNOSIS — Z3202 Encounter for pregnancy test, result negative: Secondary | ICD-10-CM | POA: Insufficient documentation

## 2023-04-19 DIAGNOSIS — Z789 Other specified health status: Secondary | ICD-10-CM

## 2023-04-19 DIAGNOSIS — N939 Abnormal uterine and vaginal bleeding, unspecified: Secondary | ICD-10-CM

## 2023-04-19 NOTE — MAU Note (Signed)
.  [name] is a 37 y.o. at Unknown here in MAU reporting: found out last month that she is pregnant, but has not started prenatal care. Today was having lower back pain and bilateral abdominal pain - went to the bathroom this evening and passed a large blood clot. Reports feeling lightheaded since. Reports still having VB - light. Denies abnormal discharge. States she is still having some cramping, but not as severe.  LMP: 03/04/2023 Onset of complaint: 1100 Pain score: 8 - back; 8 - sides Vitals:   04/19/23 2304  BP: (!) 138/91  Pulse: 76  Resp: 17  Temp: 98.3 F (36.8 C)  SpO2: 99%     FHT:NA  Lab orders placed from triage:  urine pregnancy

## 2023-04-20 ENCOUNTER — Encounter (HOSPITAL_COMMUNITY): Payer: Self-pay | Admitting: Obstetrics and Gynecology

## 2023-04-20 DIAGNOSIS — Z789 Other specified health status: Secondary | ICD-10-CM

## 2023-04-20 LAB — POCT PREGNANCY, URINE: Preg Test, Ur: NEGATIVE

## 2023-04-20 LAB — HCG, QUANTITATIVE, PREGNANCY: hCG, Beta Chain, Quant, S: <1 m[IU]/mL (ref <5)

## 2023-04-20 NOTE — Progress Notes (Signed)
Pt d/c by CNM - AVS not signed before departure.

## 2023-04-20 NOTE — MAU Provider Note (Signed)
Provider's First contact with patient was at 80    S Ms. Martha West is a 37 y.o. non-pregnant patient who presents to MAU today with complaint of positive home pregnancy test 1 month ago, lower back pain and abdominal pain that she rates 8 out of 10 that started at 11 PM.  She reports she passed a large blood clot and now having light vaginal bleeding.  She also reports that she is feeling lightheaded every since she passed a large blood clot.   O BP (!) 138/91 (BP Location: Right Arm)   Pulse 76   Temp 98.3 F (36.8 C) (Oral)   Resp 17   Ht 5\' 4"  (1.626 m)   Wt 69.1 kg   SpO2 99%   BMI 26.14 kg/m   Physical Exam Vitals and nursing note reviewed.  Constitutional:      Appearance: Normal appearance. She is normal weight.  Cardiovascular:     Rate and Rhythm: Normal rate.  Pulmonary:     Effort: Pulmonary effort is normal.  Genitourinary:    Comments: deferred Neurological:     Mental Status: She is alert and oriented to person, place, and time.  Psychiatric:        Mood and Affect: Mood normal.        Behavior: Behavior normal.        Thought Content: Thought content normal.        Judgment: Judgment normal.     A Medical screening exam complete 1. Not currently pregnant   2. Negative pregnancy test   3. Vaginal bleeding     P Discharge from MAU in stable condition Patient given the option of transfer to [facility] for further evaluation or seek care in outpatient facility of choice  Warning signs for worsening condition that would warrant emergency follow-up discussed Patient may return to MAU as needed for pregnancy related issues  Raelyn Mora, CNM 04/20/2023 1:24 AM

## 2023-11-06 ENCOUNTER — Encounter (HOSPITAL_COMMUNITY): Payer: Self-pay

## 2023-11-06 ENCOUNTER — Emergency Department (HOSPITAL_COMMUNITY)
Admission: EM | Admit: 2023-11-06 | Discharge: 2023-11-06 | Disposition: A | Discharge status: Home / Self Care | Attending: Emergency Medicine | Admitting: Emergency Medicine

## 2023-11-06 ENCOUNTER — Other Ambulatory Visit: Payer: Self-pay

## 2023-11-06 DIAGNOSIS — Z203 Contact with and (suspected) exposure to rabies: Secondary | ICD-10-CM | POA: Insufficient documentation

## 2023-11-06 DIAGNOSIS — W540XXA Bitten by dog, initial encounter: Secondary | ICD-10-CM | POA: Insufficient documentation

## 2023-11-06 DIAGNOSIS — Z23 Encounter for immunization: Secondary | ICD-10-CM | POA: Diagnosis not present

## 2023-11-06 DIAGNOSIS — Z2914 Encounter for prophylactic rabies immune globin: Secondary | ICD-10-CM | POA: Insufficient documentation

## 2023-11-06 DIAGNOSIS — S31050A Open bite of lower back and pelvis without penetration into retroperitoneum, initial encounter: Secondary | ICD-10-CM | POA: Diagnosis not present

## 2023-11-06 DIAGNOSIS — S41151A Open bite of right upper arm, initial encounter: Secondary | ICD-10-CM | POA: Diagnosis present

## 2023-11-06 MED ORDER — AMOXICILLIN-POT CLAVULANATE 875-125 MG PO TABS
1.0000 | ORAL_TABLET | Freq: Once | ORAL | Status: AC
  Administered 2023-11-06: 1 via ORAL
  Filled 2023-11-06: qty 1

## 2023-11-06 MED ORDER — AMOXICILLIN-POT CLAVULANATE 875-125 MG PO TABS
1.0000 | ORAL_TABLET | Freq: Two times a day (BID) | ORAL | 0 refills | Status: AC
Start: 2023-11-06 — End: ?

## 2023-11-06 MED ORDER — RABIES IMMUNE GLOBULIN 150 UNIT/ML IM INJ
20.0000 [IU]/kg | INJECTION | Freq: Once | INTRAMUSCULAR | Status: AC
  Administered 2023-11-06: 1500 [IU] via INTRAMUSCULAR
  Filled 2023-11-06: qty 10

## 2023-11-06 MED ORDER — BACITRACIN ZINC 500 UNIT/GM EX OINT
TOPICAL_OINTMENT | Freq: Two times a day (BID) | CUTANEOUS | Status: DC
  Filled 2023-11-06: qty 0.9

## 2023-11-06 MED ORDER — RABIES VACCINE, PCEC IM SUSR
1.0000 mL | Freq: Once | INTRAMUSCULAR | Status: AC
  Administered 2023-11-06: 1 mL via INTRAMUSCULAR
  Filled 2023-11-06: qty 1

## 2023-11-06 NOTE — ED Provider Notes (Signed)
 [place] EMERGENCY DEPARTMENT AT [facility] Provider Note   CSN: [id number] Arrival date & time: 11/06/23  1721     History  Chief Complaint  Patient presents with   Animal Bite    Martha West is a 38 y.o. female.  38 year old female presents today after a dog bite that occurred just prior to arrival.  She states this was her neighbors dog.  Typically it is chained.  She states it attempted to run at her but ended up breaking through the chain collar and attacked her on the right arm, right buttock, and right lower back.  No active bleeding.  She proceeded to go to work where she was seen by her colleagues and recommended to come into the emergency department.  She states tetanus shot is up-to-date she had to get this updated for work.  She works here at [employer]. She states she is unsure of dog's vaccination record and her neighbor could not provide these records.  She states that the dog is no longer at this residence so animal control could not get access to the dog.   The history is provided by the patient. No language interpreter was used.       Home Medications Prior to Admission medications   Medication Sig Start Date End Date Taking? Authorizing Provider  amoxicillin -clavulanate (AUGMENTIN ) 875-125 MG tablet Take 1 tablet by mouth every 12 (twelve) hours. 11/06/23  Yes Joshua Zeringue, PA-C  ibuprofen  (ADVIL ) 800 MG tablet Take 1 tablet (800 mg total) by mouth 3 (three) times daily. 07/19/19   Stephany Ehrich, MD  naproxen  (NAPROSYN ) 500 MG tablet Take 1 tablet (500 mg total) by mouth 2 (two) times daily as needed for moderate pain. 08/10/21   Petrucelli, Samantha R, PA-C  tiZANidine  (ZANAFLEX ) 4 MG tablet Take 1-2 tablets (4-8 mg total) by mouth every 6 (six) hours as needed for muscle spasms. 07/19/19   Stephany Ehrich, MD  omeprazole  (PRILOSEC) 40 MG capsule Take 1 capsule (40 mg total) by mouth daily. Patient not taking: Reported on 09/13/2018 09/16/15 06/05/19   Orvel Blanco, MD  promethazine  (PHENERGAN ) 25 MG tablet Take 1 tablet (25 mg total) by mouth every 6 (six) hours as needed for nausea or vomiting. Patient not taking: Reported on 09/13/2018 09/25/14 06/05/19  Syliva Even, MD      Allergies    Patient has no known allergies.    Review of Systems   Review of Systems  Constitutional:  Negative for chills and fever.  Skin:  Positive for wound.  All other systems reviewed and are negative.   Physical Exam Updated Vital Signs BP (!) 154/92 (BP Location: Left Arm)   Pulse 91   Temp 98.3 F (36.8 C) (Oral)   Resp 18   Ht 5\' 4"  (1.626 m)   Wt 75.3 kg   SpO2 100%   BMI 28.49 kg/m  Physical Exam Vitals and nursing note reviewed.  Constitutional:      General: She is not in acute distress.    Appearance: Normal appearance. She is not ill-appearing.  HENT:     Head: Normocephalic and atraumatic.     Nose: Nose normal.  Eyes:     Conjunctiva/sclera: Conjunctivae normal.  Pulmonary:     Effort: Pulmonary effort is normal. No respiratory distress.  Musculoskeletal:        General: No deformity.  Skin:    Findings: No rash.     Comments: Multiple but fairly superficial  wounds noted to right upper extremity, right lower back, right buttock.  Nurse Tijen present as chaperone.  Neurological:     Mental Status: She is alert.     ED Results / Procedures / Treatments   Labs (all labs ordered are listed, but only abnormal results are displayed) Labs Reviewed - No data to display  EKG None  Radiology No results found.  Procedures Procedures    Medications Ordered in ED Medications  bacitracin  ointment (has no administration in time range)  amoxicillin -clavulanate (AUGMENTIN ) 875-125 MG per tablet 1 tablet (has no administration in time range)  rabies immune globulin  (HYPERRAB/KEDRAB ) injection 1,500 Units (1,500 Units Intramuscular Given 11/06/23 1944)  rabies vaccine  (RABAVERT ) injection 1 mL (1 mL Intramuscular Given  11/06/23 1938)    ED Course/ Medical Decision Making/ A&P                                 Medical Decision Making Risk OTC drugs. Prescription drug management.   39 year old female presents today for concern of dog bite.  All wounds are fairly superficial.  Wounds have been extensively irrigated.  Bacitracin  applied.  Augmentin  dose given and Augmentin  course prescribed.  Rabies vaccination given after our discussion due to unknown vaccination status for dog as well as animal control could not get access to the dog. Patient is otherwise stable for discharge.  All wounds are fairly superficial and none require laceration repair.  Final Clinical Impression(s) / ED Diagnoses Final diagnoses:  Dog bite, initial encounter    Rx / DC Orders ED Discharge Orders          Ordered    amoxicillin -clavulanate (AUGMENTIN ) 875-125 MG tablet  Every 12 hours        11/06/23 2040              Lucina Sabal, PA-C 11/06/23 2049    Onetha Bile, MD 11/06/23 2201

## 2023-11-06 NOTE — ED Triage Notes (Signed)
 Pt reports that she was bit in her right buttock, right arm, and right back by a neighbors dog. Pt states that it broke off of the chain. Pt is unsure of dog vaccinations.

## 2023-11-06 NOTE — Discharge Instructions (Addendum)
 Your wounds did not require stitches.  Continue to keep your wound clean.  You can use Neosporin.  I have sent antibiotic into the pharmacy for you.  You did receive rabies vaccination today.  These will need to be repeated on day 3, day 7, day 14, day 28.  You can go to an urgent care or return to the emergency department to have this done. Return for any emergent symptoms.

## 2023-11-09 ENCOUNTER — Encounter (HOSPITAL_COMMUNITY): Payer: Self-pay

## 2023-11-09 ENCOUNTER — Emergency Department (HOSPITAL_COMMUNITY)
Admission: EM | Admit: 2023-11-09 | Discharge: 2023-11-09 | Disposition: A | Discharge status: Home / Self Care | Attending: Emergency Medicine | Admitting: Emergency Medicine

## 2023-11-09 DIAGNOSIS — Z23 Encounter for immunization: Secondary | ICD-10-CM | POA: Diagnosis present

## 2023-11-09 MED ORDER — RABIES VACCINE, PCEC IM SUSR
1.0000 mL | Freq: Once | INTRAMUSCULAR | Status: AC
  Administered 2023-11-09: 1 mL via INTRAMUSCULAR
  Filled 2023-11-09: qty 1

## 2023-11-09 NOTE — Discharge Instructions (Addendum)
 Follow up at [facility]gent care for Rabies series

## 2023-11-09 NOTE — ED Provider Notes (Signed)
  [place] EMERGENCY DEPARTMENT AT [facility] Provider Note   CSN: [id number] Arrival date & time: 11/09/23  1628     History  No chief complaint on file.   Martha West is a 38 y.o. female.  Pt here for a rabies shot. Pt bitten by a neighborhood dog.  The dogs shots are unknown.  Pt denies any complaints.    The history is provided by the patient. No language interpreter was used.       Home Medications Prior to Admission medications   Medication Sig Start Date End Date Taking? Authorizing Provider  amoxicillin -clavulanate (AUGMENTIN ) 875-125 MG tablet Take 1 tablet by mouth every 12 (twelve) hours. 11/06/23   Lucina Sabal, PA-C  ibuprofen  (ADVIL ) 800 MG tablet Take 1 tablet (800 mg total) by mouth 3 (three) times daily. 07/19/19   Stephany Ehrich, MD  naproxen  (NAPROSYN ) 500 MG tablet Take 1 tablet (500 mg total) by mouth 2 (two) times daily as needed for moderate pain. 08/10/21   Petrucelli, Samantha R, PA-C  tiZANidine  (ZANAFLEX ) 4 MG tablet Take 1-2 tablets (4-8 mg total) by mouth every 6 (six) hours as needed for muscle spasms. 07/19/19   Stephany Ehrich, MD  omeprazole  (PRILOSEC) 40 MG capsule Take 1 capsule (40 mg total) by mouth daily. Patient not taking: Reported on 09/13/2018 09/16/15 06/05/19  Orvel Blanco, MD  promethazine  (PHENERGAN ) 25 MG tablet Take 1 tablet (25 mg total) by mouth every 6 (six) hours as needed for nausea or vomiting. Patient not taking: Reported on 09/13/2018 09/25/14 06/05/19  Syliva Even, MD      Allergies    Patient has no known allergies.    Review of Systems   Review of Systems  All other systems reviewed and are negative.   Physical Exam Updated Vital Signs There were no vitals taken for this visit. Physical Exam Vitals reviewed.  Constitutional:      Appearance: Normal appearance.  Cardiovascular:     Rate and Rhythm: Normal rate.  Pulmonary:     Effort: Pulmonary effort is normal.  Neurological:     General:  No focal deficit present.     Mental Status: She is alert.  Psychiatric:        Mood and Affect: Mood normal.     ED Results / Procedures / Treatments   Labs (all labs ordered are listed, but only abnormal results are displayed) Labs Reviewed - No data to display  EKG None  Radiology No results found.  Procedures Procedures    Medications Ordered in ED Medications  rabies vaccine  (RABAVERT ) injection 1 mL (has no administration in time range)    ED Course/ Medical Decision Making/ A&P                                 Medical Decision Making Pt here for second rabies shot.  No complaints.  No other concerns   Risk Prescription drug management. Risk Details: Continue current medications            Final Clinical Impression(s) / ED Diagnoses Final diagnoses:  Need for rabies vaccination    Rx / DC Orders ED Discharge Orders     None      An After Visit Summary was printed and given to the patient.    Sandi Crosby, PA-C 11/09/23 1716    Sallyanne Creamer, DO 11/16/23 1138

## 2023-11-09 NOTE — ED Triage Notes (Signed)
 Pt arrived reporting she is here for her 2nd rabies shot. No other complaints
